# Patient Record
Sex: Female | Born: 1965 | Race: White | Hispanic: No | Marital: Married | Smoking: Never smoker
Health system: Southern US, Community
[De-identification: ages and names within clinical notes are randomized; demographics above are authoritative.]

## PROBLEM LIST (undated history)

## (undated) DIAGNOSIS — I6529 Occlusion and stenosis of unspecified carotid artery: Secondary | ICD-10-CM

## (undated) DIAGNOSIS — G4730 Sleep apnea, unspecified: Secondary | ICD-10-CM

## (undated) DIAGNOSIS — T7840XA Allergy, unspecified, initial encounter: Secondary | ICD-10-CM

## (undated) DIAGNOSIS — M7660 Achilles tendinitis, unspecified leg: Secondary | ICD-10-CM

## (undated) DIAGNOSIS — L509 Urticaria, unspecified: Secondary | ICD-10-CM

## (undated) DIAGNOSIS — E7800 Pure hypercholesterolemia, unspecified: Secondary | ICD-10-CM

## (undated) DIAGNOSIS — S0300XA Dislocation of jaw, unspecified side, initial encounter: Secondary | ICD-10-CM

## (undated) DIAGNOSIS — L309 Dermatitis, unspecified: Secondary | ICD-10-CM

## (undated) HISTORY — PX: WISDOM TOOTH EXTRACTION: SHX21

## (undated) HISTORY — PX: COLONOSCOPY: SHX174

## (undated) HISTORY — DX: Sleep apnea, unspecified: G47.30

## (undated) HISTORY — DX: Dislocation of jaw, unspecified side, initial encounter: S03.00XA

## (undated) HISTORY — DX: Urticaria, unspecified: L50.9

## (undated) HISTORY — DX: Dermatitis, unspecified: L30.9

## (undated) HISTORY — DX: Achilles tendinitis, unspecified leg: M76.60

## (undated) HISTORY — DX: Occlusion and stenosis of unspecified carotid artery: I65.29

## (undated) HISTORY — DX: Allergy, unspecified, initial encounter: T78.40XA

## (undated) HISTORY — PX: RETINAL LASER PROCEDURE: SHX2339

## (undated) HISTORY — DX: Pure hypercholesterolemia, unspecified: E78.00

## (undated) HISTORY — PX: DILATION AND CURETTAGE OF UTERUS: SHX78

---

## 1988-04-08 HISTORY — PX: KNEE ARTHROSCOPY: SUR90

## 2001-05-26 ENCOUNTER — Other Ambulatory Visit: Admission: RE | Admit: 2001-05-26 | Discharge: 2001-05-26 | Payer: Self-pay | Admitting: Obstetrics & Gynecology

## 2001-07-03 ENCOUNTER — Encounter (INDEPENDENT_AMBULATORY_CARE_PROVIDER_SITE_OTHER): Payer: Self-pay

## 2001-07-03 ENCOUNTER — Ambulatory Visit (HOSPITAL_COMMUNITY): Admission: RE | Admit: 2001-07-03 | Discharge: 2001-07-03 | Payer: Self-pay | Admitting: Obstetrics & Gynecology

## 2002-04-06 ENCOUNTER — Other Ambulatory Visit: Admission: RE | Admit: 2002-04-06 | Discharge: 2002-04-06 | Payer: Self-pay | Admitting: Obstetrics & Gynecology

## 2002-06-11 ENCOUNTER — Encounter: Payer: Self-pay | Admitting: Obstetrics and Gynecology

## 2002-06-11 ENCOUNTER — Ambulatory Visit (HOSPITAL_COMMUNITY): Admission: RE | Admit: 2002-06-11 | Discharge: 2002-06-11 | Payer: Self-pay | Admitting: Obstetrics and Gynecology

## 2002-10-14 ENCOUNTER — Inpatient Hospital Stay (HOSPITAL_COMMUNITY): Admission: AD | Admit: 2002-10-14 | Discharge: 2002-10-14 | Payer: Self-pay | Admitting: Obstetrics & Gynecology

## 2002-10-26 ENCOUNTER — Inpatient Hospital Stay (HOSPITAL_COMMUNITY): Admission: AD | Admit: 2002-10-26 | Discharge: 2002-10-28 | Payer: Self-pay | Admitting: Obstetrics and Gynecology

## 2002-12-02 ENCOUNTER — Other Ambulatory Visit: Admission: RE | Admit: 2002-12-02 | Discharge: 2002-12-02 | Payer: Self-pay | Admitting: Obstetrics & Gynecology

## 2003-01-17 ENCOUNTER — Ambulatory Visit (HOSPITAL_COMMUNITY): Admission: RE | Admit: 2003-01-17 | Discharge: 2003-01-17 | Payer: Self-pay | Admitting: Internal Medicine

## 2003-01-17 ENCOUNTER — Encounter: Payer: Self-pay | Admitting: Internal Medicine

## 2003-03-29 ENCOUNTER — Ambulatory Visit (HOSPITAL_COMMUNITY): Admission: RE | Admit: 2003-03-29 | Discharge: 2003-03-29 | Payer: Self-pay | Admitting: Internal Medicine

## 2003-04-15 ENCOUNTER — Emergency Department (HOSPITAL_COMMUNITY): Admission: EM | Admit: 2003-04-15 | Discharge: 2003-04-16 | Payer: Self-pay | Admitting: Emergency Medicine

## 2003-07-25 ENCOUNTER — Ambulatory Visit (HOSPITAL_COMMUNITY): Admission: RE | Admit: 2003-07-25 | Discharge: 2003-07-25 | Payer: Self-pay | Admitting: Endocrinology

## 2003-11-25 ENCOUNTER — Encounter (INDEPENDENT_AMBULATORY_CARE_PROVIDER_SITE_OTHER): Payer: Self-pay | Admitting: *Deleted

## 2003-11-25 ENCOUNTER — Ambulatory Visit (HOSPITAL_COMMUNITY): Admission: RE | Admit: 2003-11-25 | Discharge: 2003-11-25 | Payer: Self-pay | Admitting: Obstetrics & Gynecology

## 2004-03-16 ENCOUNTER — Encounter (INDEPENDENT_AMBULATORY_CARE_PROVIDER_SITE_OTHER): Payer: Self-pay | Admitting: Specialist

## 2004-03-16 ENCOUNTER — Ambulatory Visit (HOSPITAL_COMMUNITY): Admission: RE | Admit: 2004-03-16 | Discharge: 2004-03-16 | Payer: Self-pay | Admitting: Obstetrics & Gynecology

## 2004-09-06 ENCOUNTER — Other Ambulatory Visit: Admission: RE | Admit: 2004-09-06 | Discharge: 2004-09-06 | Payer: Self-pay | Admitting: Obstetrics & Gynecology

## 2005-02-13 ENCOUNTER — Inpatient Hospital Stay (HOSPITAL_COMMUNITY): Admission: AD | Admit: 2005-02-13 | Discharge: 2005-02-13 | Payer: Self-pay | Admitting: Obstetrics and Gynecology

## 2005-03-14 ENCOUNTER — Inpatient Hospital Stay (HOSPITAL_COMMUNITY): Admission: AD | Admit: 2005-03-14 | Discharge: 2005-03-15 | Payer: Self-pay | Admitting: Obstetrics and Gynecology

## 2005-04-30 ENCOUNTER — Other Ambulatory Visit: Admission: RE | Admit: 2005-04-30 | Discharge: 2005-04-30 | Payer: Self-pay | Admitting: Obstetrics & Gynecology

## 2008-04-29 ENCOUNTER — Ambulatory Visit (HOSPITAL_COMMUNITY): Admission: RE | Admit: 2008-04-29 | Discharge: 2008-04-29 | Payer: Self-pay | Admitting: Internal Medicine

## 2008-10-03 ENCOUNTER — Encounter: Payer: Self-pay | Admitting: Maternal & Fetal Medicine

## 2010-04-16 ENCOUNTER — Ambulatory Visit (HOSPITAL_COMMUNITY)
Admission: RE | Admit: 2010-04-16 | Discharge: 2010-04-16 | Payer: Self-pay | Source: Home / Self Care | Attending: Obstetrics and Gynecology | Admitting: Obstetrics and Gynecology

## 2010-04-16 ENCOUNTER — Encounter (INDEPENDENT_AMBULATORY_CARE_PROVIDER_SITE_OTHER): Payer: Self-pay | Admitting: *Deleted

## 2010-04-19 ENCOUNTER — Encounter: Payer: Self-pay | Admitting: Gastroenterology

## 2010-04-19 ENCOUNTER — Encounter (INDEPENDENT_AMBULATORY_CARE_PROVIDER_SITE_OTHER): Payer: Self-pay | Admitting: *Deleted

## 2010-04-29 ENCOUNTER — Encounter: Payer: Self-pay | Admitting: Internal Medicine

## 2010-05-09 ENCOUNTER — Encounter: Payer: Self-pay | Admitting: Obstetrics and Gynecology

## 2010-05-10 NOTE — Letter (Signed)
Summary: New Patient letter  [facility]  [address] [place], [place] [address]   Phone: [phone]  Fax: [phone]       04/19/2010 MRN: [id number]  [facility] [address] [place], [place]  [address]  Dear Katelyn Wagner,  Welcome to the Gastroenterology Division at [facility].    You are scheduled to see Dr.  Christella Hartigan on 05-25-2010 at 3pm on the 3rd floor at [facility], [address]  We ask that you try to arrive at our office 15 minutes prior to your appointment time to allow for check-in.  We would like you to complete the enclosed self-administered evaluation form prior to your visit and bring it with you on the day of your appointment.  We will review it with you.  Also, please bring a complete list of all your medications or, if you prefer, bring the medication bottles and we will list them.  Please bring your insurance card so that we may make a copy of it.  If your insurance requires a referral to see a specialist, please bring your referral form from your primary care physician.  Co-payments are due at the time of your visit and may be paid by cash, check or credit card.     Your office visit will consist of a consult with your physician (includes a physical exam), any laboratory testing he/she may order, scheduling of any necessary diagnostic testing (e.g. x-ray, ultrasound, CT-scan), and scheduling of a procedure (e.g. Endoscopy, Colonoscopy) if required.  Please allow enough time on your schedule to allow for any/all of these possibilities.    If you cannot keep your appointment, please call [phone] to cancel or reschedule prior to your appointment date.  This allows [place] the opportunity to schedule an appointment for another patient in need of care.  If you do not cancel or reschedule by 5 p.m. the business day prior to your appointment date, you will be charged a $50.00 late cancellation/no-show fee.    Thank you for choosing [facility] for your medical needs.  We appreciate the opportunity to care for you.  Please visit [place] at our website  to learn more about our practice.                     Sincerely,                                                             The Gastroenterology Division

## 2010-05-10 NOTE — Letter (Signed)
Summary: Pre Visit Letter Revised  [facility]  [address] [place], [place] [address]   Phone: [phone]  Fax: [phone]        04/19/2010 MRN: [id number] [facility] [address] [place], [place]  [address]             Procedure Date:  06-06-10  Welcome to the Gastroenterology Division at [facility].    You are scheduled to see a nurse for your pre-procedure visit on 05-23-10 at 2:30p.m. on the 3rd floor at [facility], [address].  We ask that you try to arrive at our office 15 minutes prior to your appointment time to allow for check-in.  Please take a minute to review the attached form.  If you answer "Yes" to one or more of the questions on the first page, we ask that you call the person listed at your earliest opportunity.  If you answer "No" to all of the questions, please complete the rest of the form and bring it to your appointment.    Your nurse visit will consist of discussing your medical and surgical history, your immediate family medical history, and your medications.   If you are unable to list all of your medications on the form, please bring the medication bottles to your appointment and we will list them.  We will need to be aware of both prescribed and over the counter drugs.  We will need to know exact dosage information as well.    Please be prepared to read and sign documents such as consent forms, a financial agreement, and acknowledgement forms.  If necessary, and with your consent, a friend or relative is welcome to sit-in on the nurse visit with you.  Please bring your insurance card so that we may make a copy of it.  If your insurance requires a referral to see a specialist, please bring your referral form from your primary care physician.  No co-pay is required for this nurse visit.     If you cannot keep your appointment, please call [phone] to cancel or reschedule prior to your appointment date.  This allows [place] the  opportunity to schedule an appointment for another patient in need of care.    Thank you for choosing [facility] for your medical needs.  We appreciate the opportunity to care for you.  Please visit [place] at our website  to learn more about our practice.  Sincerely, The Gastroenterology Division

## 2010-06-05 ENCOUNTER — Encounter: Payer: Self-pay | Admitting: Gastroenterology

## 2010-06-05 ENCOUNTER — Ambulatory Visit (INDEPENDENT_AMBULATORY_CARE_PROVIDER_SITE_OTHER): Payer: 59 | Admitting: Gastroenterology

## 2010-06-05 ENCOUNTER — Encounter (INDEPENDENT_AMBULATORY_CARE_PROVIDER_SITE_OTHER): Payer: Self-pay | Admitting: *Deleted

## 2010-06-05 DIAGNOSIS — R933 Abnormal findings on diagnostic imaging of other parts of digestive tract: Secondary | ICD-10-CM | POA: Insufficient documentation

## 2010-06-05 DIAGNOSIS — Z800 Family history of malignant neoplasm of digestive organs: Secondary | ICD-10-CM

## 2010-06-14 NOTE — Assessment & Plan Note (Signed)
History of Present Illness Visit Type: Initial Consult Primary GI MD: Rob Bunting MD Primary Provider: Merri Brunette, MD Requesting Provider: Marcelle Overlie, MD Chief Complaint: Consult Colonoscopy, Father Dx w/ Colon cancer in November 2011. History of Present Illness:       very pleasant 45 year old woman  her father has colon cancer, stage 4, at at 81.   She also was having some burning in her lower abd that has since completely resolved.  She had recurrent bacterial vaginosis, flagyl given twice, then cipro.  She has been taking probiotics..   she recently had a CT scan for these low abdominal, pelvic pains. she was found to have diverticulosis and a slightly thickened sigmoid colon. There was no sign of diverticulitis  No contipation, or diarrhea.  No rectal bleeding.  No           Current Medications (verified): 1)  None  Allergies (verified): No Known Drug Allergies  Past History:  Past Medical History: TMJ  Mild Scoliosis Prominent Thyroid   Past Surgical History: Knee Surgery (left) arthroscopy  Family History: Family History of Colon Cancer: Father   Family History of Diabetes:  Grandmother  Social History: married 2 daughters self employed non drinker non smoker  Review of Systems       Pertinent positive and negative review of systems were noted in the above HPI and GI specific review of systems.  All other review of systems was otherwise negative.   Vital Signs:  Patient profile:   45 year old female Height:      69 inches Weight:      160.13 pounds BMI:     23.73 Pulse rate:   64 / minute Pulse rhythm:   regular BP sitting:   108 / 64  (left arm) Cuff size:   regular  Vitals Entered By: Christie Nottingham CMA Duncan Dull) (June 05, 2010 3:45 PM)  Physical Exam  Additional Exam:  Constitutional: generally well appearing Psychiatric: alert and oriented times 3 Eyes: extraocular movements intact Mouth: oropharynx moist, no  lesions Neck: supple, no lymphadenopathy Cardiovascular: heart regular rate and rythm Lungs: CTA bilaterally Abdomen: soft, non-tender, non-distended, no obvious ascites, no peritoneal signs, normal bowel sounds Extremities: no lower extremity edema bilaterally Skin: no lesions on visible extremities    Impression & Recommendations:  Problem # 1:   family histor colon Cancer, thickened sigmoid colon on CT scan recently  her lower abdominal, pelvic discomfort have completely resolved. we should proceed with a full colonoscopy at her soonest convenience given the thickened sigmoid colon. This is probably artifact. She also has family history of colon cancer. I see no reason for any further blood tests or imaging studies.  Patient Instructions: 1)  You will be scheduled to have a colonoscopy. 2)  The medication list was reviewed and reconciled.  All changed / newly prescribed medications were explained.  A complete medication list was provided to the patient / caregiver.  Appended Document: Orders Update/movi    Clinical Lists Changes  Problems: Added new problem of FAMILIY HX MALIG NEOPLASM OTH URINARY ORGANS (ICD-V16.59) Added new problem of NONSPECIFIC ABN FINDING RAD & OTH EXAM GI TRACT (ICD-793.4) Medications: Added new medication of MOVIPREP 100 GM  SOLR (PEG-KCL-NACL-NASULF-NA ASC-C) As per prep instructions. - Signed Rx of MOVIPREP 100 GM  SOLR (PEG-KCL-NACL-NASULF-NA ASC-C) As per prep instructions.;  #1 x 0;  Signed;  Entered by: Chales Abrahams CMA (AAMA);  Authorized by: Rachael Fee MD;  Method used: Electronically  to [facility] [address] [phone]*, [address] [place], [place], [place]  [address], Ph: [phone] or [phone], Fax: [phone] Orders: Added new Test order of Colonoscopy (Colon) - Signed    Prescriptions: MOVIPREP 100 GM  SOLR (PEG-KCL-NACL-NASULF-NA ASC-C) As per prep instructions.  #1 x 0   Entered by:   Chales Abrahams CMA (AAMA)    Authorized by:   Rachael Fee MD   Signed by:   Chales Abrahams CMA (AAMA) on 06/05/2010   Method used:   [facility]        [facility] [address] [phone]* (retail)       [address]       [place], [place]  [address]       Ph: [phone] or [phone]       Fax: [phone]   RxID:   [id number]

## 2010-06-14 NOTE — Letter (Signed)
Summary: [facility] Instructions  [facility]  [address] [place], [place] [address]   Phone: [phone]  Fax: [phone]       Katelyn Wagner    02-May-1965    MRN: [id number]        Procedure Day /Date:06/15/10 FRI     Arrival Time:830 am     Procedure Time:930 am     Location of Procedure:                    X  [facility] (4th Floor)                        PREPARATION FOR COLONOSCOPY WITH MOVIPREP   Starting 5 days prior to your procedure 06/10/10 do not eat nuts, seeds, popcorn, corn, beans, peas,  salads, or any raw vegetables.  Do not take any fiber supplements (e.g. Metamucil, Citrucel, and Benefiber).  THE DAY BEFORE YOUR PROCEDURE         DATE: 06/14/10  DAY: THURS  1.  Drink clear liquids the entire day-NO SOLID FOOD  2.  Do not drink anything colored red or purple.  Avoid juices with pulp.  No orange juice.  3.  Drink at least 64 oz. (8 glasses) of fluid/clear liquids during the day to prevent dehydration and help the prep work efficiently.  CLEAR LIQUIDS INCLUDE: Water Jello Ice Popsicles Tea (sugar ok, no milk/cream) Powdered fruit flavored drinks Coffee (sugar ok, no milk/cream) Gatorade Juice: apple, white grape, white cranberry  Lemonade Clear bullion, consomm, broth Carbonated beverages (any kind) Strained chicken noodle soup Hard Candy                             4.  In the morning, mix first dose of MoviPrep solution:    Empty 1 Pouch A and 1 Pouch B into the disposable container    Add lukewarm drinking water to the top line of the container. Mix to dissolve    Refrigerate (mixed solution should be used within 24 hrs)  5.  Begin drinking the prep at 5:00 p.m. The MoviPrep container is divided by 4 marks.   Every 15 minutes drink the solution down to the next mark (approximately 8 oz) until the full liter is complete.   6.  Follow completed prep with 16 oz of clear liquid of your choice (Nothing red or purple).   Continue to drink clear liquids until bedtime.  7.  Before going to bed, mix second dose of MoviPrep solution:    Empty 1 Pouch A and 1 Pouch B into the disposable container    Add lukewarm drinking water to the top line of the container. Mix to dissolve    Refrigerate  THE DAY OF YOUR PROCEDURE      DATE: 06/15/10 DAY: Caleen [place]  Beginning at 430 a.m. (5 hours before procedure):         1. Every 15 minutes, drink the solution down to the next mark (approx 8 oz) until the full liter is complete.  2. Follow completed prep with 16 oz. of clear liquid of your choice.    3. You may drink clear liquids until 730 am (2 HOURS BEFORE PROCEDURE).   MEDICATION INSTRUCTIONS  Unless otherwise instructed, you should take regular prescription medications with a small sip of water   as early as possible the morning of your procedure.  OTHER INSTRUCTIONS  You will need a responsible adult at least 45 years of age to accompany you and drive you home.   This person must remain in the waiting room during your procedure.  Wear loose fitting clothing that is easily removed.  Leave jewelry and other valuables at home.  However, you may wish to bring a book to read or  an iPod/MP3 player to listen to music as you wait for your procedure to start.  Remove all body piercing jewelry and leave at home.  Total time from sign-in until discharge is approximately 2-3 hours.  You should go home directly after your procedure and rest.  You can resume normal activities the  day after your procedure.  The day of your procedure you should not:   Drive   Make legal decisions   Operate machinery   Drink alcohol   Return to work  You will receive specific instructions about eating, activities and medications before you leave.    The above instructions have been reviewed and explained to me by   _______________________    I fully understand and can verbalize these instructions  _____________________________ Date _________

## 2010-06-15 ENCOUNTER — Other Ambulatory Visit: Payer: 59 | Admitting: Gastroenterology

## 2010-07-24 ENCOUNTER — Other Ambulatory Visit: Payer: 59 | Admitting: Gastroenterology

## 2010-08-17 ENCOUNTER — Other Ambulatory Visit: Payer: 59 | Admitting: Gastroenterology

## 2010-08-24 NOTE — Op Note (Signed)
[facility]  PatientIVONNE, FREEBURG Visit Number: [id number] MRN: [id number]          Service Type: DSU Location: [facility] Attending Physician:  Minette Headland Dictated by:   Freddy Finner, M.D. Proc. 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  The patient has requested voluntary interruption of her pregnancy.  She is admitted at this time for that purpose.  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  The patient has requested voluntary interruption of her pregnancy.  She is admitted at this time for that purpose.  OPERATION:                    Dilatation and evacuation.  SURGEON:                      Freddy Finner, M.D.  ANESTHESIA:                   General.  COMPLICATIONS:                None.  ESTIMATED BLOOD LOSS:         100 cc or less.  INDICATIONS:                  The patient is a 45 year old who had a level 2 scan with Dr. Norina Buzzard at [facility].  The fetus was found to have a marked anomaly with absence of one diaphragm.  The fetal chromosomes were pending, although the fish test at the time of the ultrasound has not shown Downs syndrome.  The patient requested definitive intervention to terminate the pregnancy and did not feel like she could tolerate laboring to do so.  It was felt the safest procedure was to proceed with D&E.  The potential risk of this procedure including the risk of perforation and the actual loss of fertility was discussed with the patient.  DESCRIPTION OF PROCEDURE:     A thick laminaria was placed in the cervix on the day prior to surgery and a gauze pack placed to hold it into  position. She was admitted on the day of surgery.  She was brought to the operating room and placed under adequate anesthesia.  Placed in the dorsal lithotomy position.  Betadine prep of the perineum and vagina was carried out.  The sponge and laminaria were then removed digitally.  Additional Betadine prep was carried out.  Bivalve speculum was introduced.  Anterior cervical lip was grasped with the ring forceps.  The cervix was progressively dilated to 47 with Hegar dilators.  A 14 mm suction cannula was then introduced and aspiration produced obvious parts of conception.  This was continued until it was felt that the majority of tissue was removed.  Large placental forceps were then used to remove remaining segments of cranium.  Repeat vacuum aspiration was carried out.  Gentle sharp curettage and repeat exploration with the placental forceps confirmed complete evacuation of the cavity.  The procedure was terminated.  At  this point, the instruments were removed. Fetal lower extremity was sent for chromosomal analysis.  The patient was given intravenous Pitocin in her IV fluid and 0.25 of IM Methergine.  She was treated perioperatively with IV Cefotan 1 gm and gentamicin.  This was done as prophylaxis for SBE because of mitral valve prolapse.  The patient did tolerate the procedure well.  After recovering, she was discharged home with routine outpatient surgical instructions and for follow up in the office in approximately two weeks. Dictated by:   Freddy Finner, M.D. Attending Physician:  Minette Headland DD:  07/05/01 TD:  07/05/01 Job: [id number]

## 2010-08-24 NOTE — Op Note (Signed)
Katelyn Wagner, Katelyn Wagner                 ACCOUNT NO.:  [id number]   MEDICAL RECORD NO.:  [id number]          PATIENT TYPE:  AMB   LOCATION:  SDC                           FACILITY:  [facility]   PHYSICIAN:  Freddy Finner, M.D.   DATE OF BIRTH:  January 27, 1966   DATE OF PROCEDURE:  03/16/2004  DATE OF DISCHARGE:                                 OPERATIVE REPORT   PREOPERATIVE DIAGNOSIS:  Missed abortion.   POSTOPERATIVE DIAGNOSIS:  Missed abortion.   OPERATIVE PROCEDURE:  D&E.   SURGEON:  Freddy Finner, M.D.   ANESTHESIA:  Managed intravenous sedation with paracervical block.   ESTIMATED INTRAOPERATIVE BLOOD LOSS:  50 mL.   INTRAOPERATIVE COMPLICATIONS:  None.   The patient is a 45 year old who was found to have what appeared to be an  early triplet pregnancy.  Over a period of approximately 3 weeks she had no  growth of the sacs and deterioration of the placenta.  There was no fetal  pole or yolk sac in any of the gestational sacs.  The patient is now  admitted for D&E.   She was admitted on the morning of surgery, brought to the operating room,  placed under adequate intravenous sedation, placed in the dorsal lithotomy  position.  Betadine prep was carried out in the usual fashion.  Sterile  drapes were applied.  The cervix was visualized using a bivalve vaginal  speculum.  The cervix was grasped on the anterior lip with a single tooth  tenaculum.  Paracervical block was placed using 10 mL of 1% plain Xylocaine.  Injections were made at 4 and 8 o'clock in the vaginal fornices.  The cervix  was progressively dilated to 27 with Pratts.  A 9 mm curved suction cannula  was introduced and aspiration produced obvious products of conception.  The  vacuum aspiration was continued until it was felt the cavity was evacuated.  This was confirmed by gentle curettage and exploration with the Randall  stone forceps and repeat vacuum aspiration.  The patient tolerated the  operative procedure well, all  instruments removed.  She was given Toradol 30  mg IV.  She was awakened and taken to the recovery room in good condition.     Hosie Spangle   WRN/MEDQ  D:  03/16/2004  T:  03/16/2004  Job:  [id number]

## 2010-08-24 NOTE — Op Note (Signed)
Katelyn Wagner, Katelyn Wagner                             ACCOUNT NO.:  [id number]   MEDICAL RECORD NO.:  [id number]                   PATIENT TYPE:  AMB   LOCATION:  SDC                                  FACILITY:  [facility]   PHYSICIAN:  Freddy Finner, M.D.                DATE OF BIRTH:  Apr 15, 1965   DATE OF PROCEDURE:  DATE OF DISCHARGE:                                 OPERATIVE REPORT   PREOPERATIVE DIAGNOSIS:  Missed abortion   POSTOPERATIVE DIAGNOSIS:  Missed abortion.   OPERATIVE PROCEDURE:  Dilation evacuation   SURGEON:  Freddy Finner, M.D.   ANESTHESIA:  Intravenous sedation, paracervical block.   ESTIMATED BLOOD LOSS:  Less than or equal to 20 cc   INTRAOPERATIVE COMPLICATIONS:  None.   BLOOD TYPE:  Rh positive   The patient is a 45 year old who presented in early pregnancy and was found  to have a nonviable intrauterine pregnancy measuring less than dates but  showing a gestational sac  and a yolk sac but no fetus.  She is now admitted  for D&E.   She was admitted on the day of surgery, brought to the operating room under  adequate intravenous sedation, placed in the dorsal lithotomy position using  the Stanton stirrups system.  Betadine prep was carried out in the usual  fashion after she received intravenous sedation.  Bivalved speculum was  introduced and cervix visualized.  Pericervical block was placed using 10 cc  of 1% Xylocaine, injections of approximately 5 cc provided each of the 4 and  8 o'clock sites.  Cervix was grasped with the anterior lip with a single  tooth tenaculum.  Cervix was dilated to 23 with Pratt's.  A 7 mm curved  suction cannula was introduced and aspiration produced obvious products of  conception.  This was continued until it was felt the cavity was evacuated.  Gentle sharp curettage and exploration with Randall-Stokes forceps was  carried out, followed by repeat vacuum aspiration.  It was felt that the  procedure was complete and all parts of  conception removed.  The patient was  given 0.2 mg of Methergine IM, she was given 30 mg of Toradol IV for  postoperative analgesia.  She was taken to the recovery room in good  condition and will be discharged in the immediate postoperative period for  follow up in the office in approximately 2 weeks.  She is to avoid vaginal  entry for at least 1 week.  She is to call for fevers, severe pain or heavy  bleeding.                                               Freddy Finner, M.D.    WRN/MEDQ  D:  11/25/2003  T:  11/25/2003  Job:  [id number]

## 2010-09-07 ENCOUNTER — Other Ambulatory Visit: Payer: 59 | Admitting: Gastroenterology

## 2010-11-02 ENCOUNTER — Other Ambulatory Visit: Payer: 59 | Admitting: Gastroenterology

## 2010-12-19 ENCOUNTER — Encounter: Payer: Self-pay | Admitting: Gastroenterology

## 2010-12-19 ENCOUNTER — Ambulatory Visit (AMBULATORY_SURGERY_CENTER): Payer: 59 | Admitting: *Deleted

## 2010-12-19 VITALS — Ht 69.0 in | Wt 168.0 lb

## 2010-12-19 DIAGNOSIS — R933 Abnormal findings on diagnostic imaging of other parts of digestive tract: Secondary | ICD-10-CM

## 2010-12-19 DIAGNOSIS — Z1211 Encounter for screening for malignant neoplasm of colon: Secondary | ICD-10-CM

## 2010-12-19 DIAGNOSIS — Z800 Family history of malignant neoplasm of digestive organs: Secondary | ICD-10-CM

## 2010-12-19 MED ORDER — SUPREP BOWEL PREP KIT 17.5-3.13-1.6 GM/177ML PO SOLN: 1.0000 | ORAL | Status: DC

## 2010-12-26 ENCOUNTER — Ambulatory Visit (AMBULATORY_SURGERY_CENTER): Payer: 59 | Admitting: Gastroenterology

## 2010-12-26 ENCOUNTER — Encounter: Payer: Self-pay | Admitting: Gastroenterology

## 2010-12-26 DIAGNOSIS — Z800 Family history of malignant neoplasm of digestive organs: Secondary | ICD-10-CM

## 2010-12-26 DIAGNOSIS — R933 Abnormal findings on diagnostic imaging of other parts of digestive tract: Secondary | ICD-10-CM

## 2010-12-26 DIAGNOSIS — Z1211 Encounter for screening for malignant neoplasm of colon: Secondary | ICD-10-CM

## 2010-12-26 DIAGNOSIS — D126 Benign neoplasm of colon, unspecified: Secondary | ICD-10-CM

## 2010-12-26 MED ORDER — SODIUM CHLORIDE 0.9 % IV SOLN: 500.0000 mL | INTRAVENOUS | Status: DC

## 2010-12-26 NOTE — Patient Instructions (Signed)
Green and blue discharge instructions reviewed with patient and care partner.  Impressions/recommendations:  Polyp (handout given) Diverticulosis (handout given)  High fiber diet handout given.  Resume medications as you were taking them prior to your procedure.

## 2010-12-27 ENCOUNTER — Telehealth: Payer: Self-pay | Admitting: *Deleted

## 2010-12-27 NOTE — Telephone Encounter (Signed)

## 2012-06-22 ENCOUNTER — Other Ambulatory Visit (HOSPITAL_COMMUNITY): Payer: Self-pay | Admitting: Endocrinology

## 2012-06-22 DIAGNOSIS — E041 Nontoxic single thyroid nodule: Secondary | ICD-10-CM

## 2012-06-25 ENCOUNTER — Ambulatory Visit (HOSPITAL_COMMUNITY)
Admission: RE | Admit: 2012-06-25 | Discharge: 2012-06-25 | Disposition: A | Discharge status: Home / Self Care | Payer: 59 | Source: Ambulatory Visit | Attending: Endocrinology | Admitting: Endocrinology

## 2012-06-25 DIAGNOSIS — E041 Nontoxic single thyroid nodule: Secondary | ICD-10-CM | POA: Insufficient documentation

## 2012-11-02 ENCOUNTER — Other Ambulatory Visit (HOSPITAL_COMMUNITY): Payer: Self-pay | Admitting: Endocrinology

## 2012-11-02 DIAGNOSIS — E041 Nontoxic single thyroid nodule: Secondary | ICD-10-CM

## 2012-12-10 ENCOUNTER — Telehealth (HOSPITAL_COMMUNITY): Payer: Self-pay | Admitting: *Deleted

## 2012-12-15 ENCOUNTER — Telehealth (HOSPITAL_COMMUNITY): Payer: Self-pay | Admitting: Internal Medicine

## 2012-12-22 ENCOUNTER — Telehealth (HOSPITAL_COMMUNITY): Payer: Self-pay | Admitting: *Deleted

## 2013-01-01 ENCOUNTER — Telehealth (HOSPITAL_COMMUNITY): Payer: Self-pay | Admitting: *Deleted

## 2013-01-11 ENCOUNTER — Encounter (HOSPITAL_COMMUNITY): Payer: Self-pay | Admitting: *Deleted

## 2013-01-11 ENCOUNTER — Telehealth (HOSPITAL_COMMUNITY): Payer: Self-pay | Admitting: *Deleted

## 2013-02-22 ENCOUNTER — Emergency Department (HOSPITAL_COMMUNITY)
Admission: EM | Admit: 2013-02-22 | Discharge: 2013-02-22 | Disposition: A | Discharge status: Home / Self Care | Payer: 59 | Attending: Emergency Medicine | Admitting: Emergency Medicine

## 2013-02-22 ENCOUNTER — Encounter (HOSPITAL_COMMUNITY): Payer: Self-pay | Admitting: Emergency Medicine

## 2013-02-22 ENCOUNTER — Emergency Department (HOSPITAL_COMMUNITY): Payer: 59

## 2013-02-22 DIAGNOSIS — S161XXA Strain of muscle, fascia and tendon at neck level, initial encounter: Secondary | ICD-10-CM

## 2013-02-22 DIAGNOSIS — Y92410 Unspecified street and highway as the place of occurrence of the external cause: Secondary | ICD-10-CM | POA: Insufficient documentation

## 2013-02-22 DIAGNOSIS — Y9389 Activity, other specified: Secondary | ICD-10-CM | POA: Insufficient documentation

## 2013-02-22 DIAGNOSIS — S0990XA Unspecified injury of head, initial encounter: Secondary | ICD-10-CM | POA: Insufficient documentation

## 2013-02-22 DIAGNOSIS — S139XXA Sprain of joints and ligaments of unspecified parts of neck, initial encounter: Secondary | ICD-10-CM | POA: Insufficient documentation

## 2013-02-22 DIAGNOSIS — IMO0002 Reserved for concepts with insufficient information to code with codable children: Secondary | ICD-10-CM | POA: Insufficient documentation

## 2013-02-22 MED ORDER — CYCLOBENZAPRINE HCL 10 MG PO TABS: 10.0000 mg | ORAL_TABLET | Freq: Three times a day (TID) | ORAL | Status: DC | PRN
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
## 2013-02-22 NOTE — ED Provider Notes (Signed)
Medical screening examination/treatment/procedure(s) were performed by non-physician practitioner and as supervising physician I was immediately available for consultation/collaboration.  EKG Interpretation   None        Ethelda Chick, MD 02/22/13 2232

## 2013-02-22 NOTE — ED Notes (Signed)
Patient transported to X-ray 

## 2013-02-22 NOTE — ED Notes (Signed)
Patient returned from xray at this time.

## 2013-06-25 ENCOUNTER — Ambulatory Visit (HOSPITAL_COMMUNITY): Payer: 59

## 2014-12-24 ENCOUNTER — Emergency Department (HOSPITAL_COMMUNITY)
Admission: EM | Admit: 2014-12-24 | Discharge: 2014-12-24 | Disposition: A | Discharge status: Home / Self Care | Payer: 59 | Attending: Emergency Medicine | Admitting: Emergency Medicine

## 2014-12-24 ENCOUNTER — Encounter (HOSPITAL_COMMUNITY): Payer: Self-pay | Admitting: Emergency Medicine

## 2014-12-24 ENCOUNTER — Emergency Department (HOSPITAL_COMMUNITY): Payer: 59

## 2014-12-24 DIAGNOSIS — S99912A Unspecified injury of left ankle, initial encounter: Secondary | ICD-10-CM | POA: Diagnosis present

## 2014-12-24 DIAGNOSIS — Y9289 Other specified places as the place of occurrence of the external cause: Secondary | ICD-10-CM | POA: Diagnosis not present

## 2014-12-24 DIAGNOSIS — Y998 Other external cause status: Secondary | ICD-10-CM | POA: Insufficient documentation

## 2014-12-24 DIAGNOSIS — S93402A Sprain of unspecified ligament of left ankle, initial encounter: Secondary | ICD-10-CM | POA: Diagnosis not present

## 2014-12-24 DIAGNOSIS — W1849XA Other slipping, tripping and stumbling without falling, initial encounter: Secondary | ICD-10-CM | POA: Insufficient documentation

## 2014-12-24 DIAGNOSIS — Y9389 Activity, other specified: Secondary | ICD-10-CM | POA: Insufficient documentation

## 2014-12-24 NOTE — ED Notes (Signed)
Applied ankle support to left, demonstrated proper usage of crutches

## 2014-12-24 NOTE — Discharge Instructions (Signed)
1. Medications: alternate ibuprofen and tylenol for pain control, usual home medications 2. Treatment: rest, ice, elevate and use brace, drink plenty of fluids, gentle stretching 3. Follow Up: Please followup with orthopedics as directed or your PCP in 1 week if no improvement for discussion of your diagnoses and further evaluation after today's visit; if you do not have a primary care doctor use the resource guide provided to find one; Please return to the ER for worsening symptoms or other concerns   Ankle Sprain An ankle sprain is an injury to the strong, fibrous tissues (ligaments) that hold the bones of your ankle joint together.  CAUSES An ankle sprain is usually caused by a fall or by twisting your ankle. Ankle sprains most commonly occur when you step on the outer edge of your foot, and your ankle turns inward. People who participate in sports are more prone to these types of injuries.  SYMPTOMS   Pain in your ankle. The pain may be present at rest or only when you are trying to stand or walk.  Swelling.  Bruising. Bruising may develop immediately or within 1 to 2 days after your injury.  Difficulty standing or walking, particularly when turning corners or changing directions. DIAGNOSIS  Your caregiver will ask you details about your injury and perform a physical exam of your ankle to determine if you have an ankle sprain. During the physical exam, your caregiver will press on and apply pressure to specific areas of your foot and ankle. Your caregiver will try to move your ankle in certain ways. An X-ray exam may be done to be sure a bone was not broken or a ligament did not separate from one of the bones in your ankle (avulsion fracture).  TREATMENT  Certain types of braces can help stabilize your ankle. Your caregiver can make a recommendation for this. Your caregiver may recommend the use of medicine for pain. If your sprain is severe, your caregiver may refer you to a surgeon who  helps to restore function to parts of your skeletal system (orthopedist) or a physical therapist. [place] ice to your injury for 1-2 days or as directed by your caregiver. Applying ice helps to reduce inflammation and pain.  Put ice in a plastic bag.  Place a towel between your skin and the bag.  Leave the ice on for 15-20 minutes at a time, every 2 hours while you are awake.  Only take over-the-counter or prescription medicines for pain, discomfort, or fever as directed by your caregiver.  Elevate your injured ankle above the level of your heart as much as possible for 2-3 days.  If your caregiver recommends crutches, use them as instructed. Gradually put weight on the affected ankle. Continue to use crutches or a cane until you can walk without feeling pain in your ankle.  If you have a plaster splint, wear the splint as directed by your caregiver. Do not rest it on anything harder than a pillow for the first 24 hours. Do not put weight on it. Do not get it wet. You may take it off to take a shower or bath.  You may have been given an elastic bandage to wear around your ankle to provide support. If the elastic bandage is too tight (you have numbness or tingling in your foot or your foot becomes cold and blue), adjust the bandage to make it comfortable.  If you have an air splint, you may blow more air  into it or let air out to make it more comfortable. You may take your splint off at night and before taking a shower or bath. Wiggle your toes in the splint several times per day to decrease swelling. SEEK MEDICAL CARE IF:   You have rapidly increasing bruising or swelling.  Your toes feel extremely cold or you lose feeling in your foot.  Your pain is not relieved with medicine. SEEK IMMEDIATE MEDICAL CARE IF:  Your toes are numb or blue.  You have severe pain that is increasing. MAKE SURE YOU:   Understand these instructions.  Will watch your  condition.  Will get help right away if you are not doing well or get worse. Document Released: 03/25/2005 Document Revised: 12/18/2011 Document Reviewed: 04/06/2011 [facility] Patient Information 2015 [place], [place]. This information is not intended to replace advice given to you by your health care provider. Make sure you discuss any questions you have with your health care provider.

## 2014-12-24 NOTE — ED Notes (Signed)
Pt reports she twisted L ankle 1 hour ago. Pms intact.

## 2014-12-24 NOTE — ED Provider Notes (Signed)
CSN: [id number]     Arrival date & time 12/24/14  2029 History  This chart was scribed for non-physician practitioner Abigail Butts, PA-C, working with Noemi Chapel, MD by Ludger Nutting, ED Scribe. This patient was seen in room TR05C/TR05C and the patient's care was started at 10:30 PM.    Chief Complaint  Patient presents with  . Ankle Injury   The history is provided by the patient. No language interpreter was used.     HPI Comments: Katelyn Wagner is a 49 y.o. female who presents to the Emergency Department complaining of a left ankle injury that occurred 3.5 hours ago when she tripped over her dog. She reports sudden onset, constant, unchanged left ankle pain that is worse with bearing weight and walking. She reports associated swelling for which she has applied ice with some relief. She has taken ibuprofen 800 mg with mild relief. She denies head injury or LOC. She denies anti-coagulant use. She denies weakness, numbness.    Past Medical History  Diagnosis Date  . Allergy     sulfer dioxide  . Pain in Achilles tendon     bilateral   Past Surgical History  Procedure Laterality Date  . Knee arthroscopy  1990    left  . Dilation and curettage of uterus  2010 &2005   Family History  Problem Relation Age of Onset  . Colon cancer Father    Social History  Substance Use Topics  . Smoking status: Never Smoker   . Smokeless tobacco: Never Used  . Alcohol Use: No   OB History    No data available     Review of Systems  Constitutional: Negative for fever and chills.  Gastrointestinal: Negative for nausea and vomiting.  Musculoskeletal: Positive for myalgias, joint swelling and arthralgias. Negative for back pain, neck pain and neck stiffness.  Skin: Negative for wound.  Neurological: Negative for syncope, weakness and numbness.  Hematological: Does not bruise/bleed easily.  All other systems reviewed and are negative.     Allergies  Review of patient's  allergies indicates no known allergies.  Home Medications   Prior to Admission medications   Medication Sig Start Date End Date Taking? Authorizing Provider  cyclobenzaprine (FLEXERIL) 10 MG tablet Take 1 tablet (10 mg total) by mouth 3 (three) times daily as needed for muscle spasms. 02/22/13   Nicole Pisciotta, PA-C  diphenhydrAMINE (BENADRYL) 25 mg capsule Take 25-50 mg by mouth every 6 (six) hours as needed.    Historical Provider, MD  ibuprofen (ADVIL,MOTRIN) 200 MG tablet Take 400 mg by mouth every 6 (six) hours as needed. Headaches and tendonopathy     Historical Provider, MD  SUPREP BOWEL PREP SOLN Take 1 kit by mouth as directed. 12/19/10   Milus Banister, MD   BP 128/80 mmHg  Pulse 68  Temp(Src) 98.2 F (36.8 C) (Oral)  Resp 16  SpO2 100%  LMP 12/09/2014 Physical Exam  Constitutional: She appears well-developed and well-nourished. No distress.  HENT:  Head: Normocephalic and atraumatic.  Eyes: Conjunctivae are normal.  Neck: Normal range of motion.  Cardiovascular: Normal rate, regular rhythm and intact distal pulses.   Capillary refill < 3 sec  Pulmonary/Chest: Effort normal and breath sounds normal.  Musculoskeletal: She exhibits tenderness. She exhibits no edema.  ROM: full ROM of the left great toe, left ankle, left knee, and left hip. Swelling and ecchymosis to the lateral portion of left ankle and proximal forefoot with tenderness to palpation but no palpable  deformity.   Neurological: She is alert. Coordination normal.  Sensation intact to dull and sharp in the LLE Strength 5/5 with dorsiflexion and plantar flexion of the left ankle.   Skin: Skin is warm and dry. She is not diaphoretic.  No tenting of the skin  Psychiatric: She has a normal mood and affect.  Nursing note and vitals reviewed.   ED Course  Procedures (including critical care time)  DIAGNOSTIC STUDIES: Oxygen Saturation is 100% on RA, normal by my interpretation.    COORDINATION OF  CARE: 10:33 PM Discussed treatment plan with pt at bedside and pt agreed to plan.   Labs Review Labs Reviewed - No data to display  Imaging Review Dg Ankle Complete Left  12/24/2014   CLINICAL DATA:  49 year old female with trauma and left ankle pain.  EXAM: LEFT ANKLE COMPLETE - 3+ VIEW  COMPARISON:  None.  FINDINGS: There is no evidence of fracture, dislocation, or joint effusion. There is no evidence of arthropathy or other focal bone abnormality. Soft tissues are unremarkable.  IMPRESSION: Negative.   Electronically Signed   By: Anner Crete M.D.   On: 12/24/2014 22:28   I have personally reviewed and evaluated these images as part of my medical decision-making.   EKG Interpretation None      MDM   Final diagnoses:  Ankle sprain, left, initial encounter    Katelyn Wagner presents with ankle pain.  Patient X-Ray negative for obvious fracture or dislocation. Pain managed in ED. Pt advised to follow up with orthopedics if symptoms persist for possibility of missed fracture diagnosis. Patient given brace while in ED, conservative therapy recommended and discussed. Patient will be dc home & is agreeable with above plan.   I personally performed the services described in this documentation, which was scribed in my presence. The recorded information has been reviewed and is accurate.   Jarrett Soho Muthersbaugh, PA-C 12/24/14 2345  Noemi Chapel, MD 12/25/14 [phone]

## 2015-02-06 ENCOUNTER — Emergency Department (HOSPITAL_COMMUNITY): Admission: EM | Admit: 2015-02-06 | Discharge: 2015-02-06 | Discharge status: Absent without leave | Payer: Self-pay

## 2015-10-30 ENCOUNTER — Encounter: Payer: Self-pay | Admitting: Gastroenterology

## 2015-11-06 ENCOUNTER — Encounter: Payer: Self-pay | Admitting: Gastroenterology

## 2015-12-25 ENCOUNTER — Telehealth: Payer: Self-pay | Admitting: *Deleted

## 2015-12-25 ENCOUNTER — Ambulatory Visit (AMBULATORY_SURGERY_CENTER): Payer: Self-pay | Admitting: *Deleted

## 2015-12-25 VITALS — Ht 68.5 in | Wt 164.0 lb

## 2015-12-25 DIAGNOSIS — Z800 Family history of malignant neoplasm of digestive organs: Secondary | ICD-10-CM

## 2015-12-25 MED ORDER — NA SULFATE-K SULFATE-MG SULF 17.5-3.13-1.6 GM/177ML PO SOLN: ORAL | 0 refills | Status: DC

## 2015-12-25 NOTE — Telephone Encounter (Signed)
Patient came in for pre-visit, recall colon is on 01/02/16. She states been having "very mild localized sharpe pain in mid. Chest that last 1-2 minutes and may occur again that day and may happen again in the next few days from that but then goes away and may not have this chest pain for 2-3 months". Pt does not what causes this and denies heartburn. She states this has been happening for about 15 years, and she did tell her PCP and she had EKG done about 5 years ago and that was "normal". I did see in EPIC that Dr.Pharr her PCP order a stress test back in 2014 and she states she never had that done. Patient seems concerns and said she is going to call her PCP to see if she should have the stress test now before colonoscopy. Is she okay to have colonoscopy at this time or wait until stress test complete? Please advise. Thanks, Robbin PV

## 2015-12-25 NOTE — Progress Notes (Signed)
Patient denies any allergies to eggs or soy. Patient denies any problems with anesthesia/sedation. Patient denies any oxygen use at home and does not take any diet/weight loss medications. Sent phone note to Dr.Jacobs.

## 2015-12-25 NOTE — Telephone Encounter (Signed)
It does not sound like anything serious but please cancel the upcoming colonoscopy since she is going to be discussing the chest pains again with her primary care physician and I assume he will reorder the stress test. After the cardiac testing is done I would like to hear back from her about rescheduling the colonoscopy.

## 2015-12-25 NOTE — Telephone Encounter (Signed)
Spoke with patient. Gave her Dr.Jacobs' recommendations. She is aware her colonoscopy is cancelled at this time and SHE needs to call us back once stress is completed and with results to make colon appointment then.

## 2015-12-25 NOTE — Telephone Encounter (Signed)
Called patient, no answer. Left message for her to call me back at my direct office number.

## 2016-01-02 ENCOUNTER — Encounter: Payer: 59 | Admitting: Gastroenterology

## 2016-02-27 ENCOUNTER — Telehealth: Payer: Self-pay

## 2016-02-27 NOTE — Telephone Encounter (Signed)
Katelyn Wagner called and states he was reviewing charts for 03/04/16.  The pt was cancelled because she needed cardiology clearance.  I called the pt and she states she was cleared by Dr Wynonia Lawman and will have the records faxed to our office today or at the latest tomorrow.  I spoke back with Katelyn Wagner and notified him and he will agree to the procedure as long as the records are reviewed and ok'd by Dr Ardis Hughs. I will make sure they get on his desk for review.  Dr Ardis Hughs I am off tomorrow but have left messages for everyone to be on the look out for the records.

## 2016-02-27 NOTE — Telephone Encounter (Signed)
Dr Ardis Hughs the records from cardiology are on your desk

## 2016-02-27 NOTE — Telephone Encounter (Signed)
Patient states that she will fax records over this afternoon and is requesting a callback when we receive the records. best # [phone].

## 2016-02-28 NOTE — Telephone Encounter (Signed)
Spoke with pt and she is aware.

## 2016-02-28 NOTE — Telephone Encounter (Signed)
Office note from her cardiologist Dr. Wynonia Lawman dated 01/03/2016   She underwent a stress test with EKG. Dr. Wynonia Lawman wrote that the test was clinically and EKG negative for ischemia. His recommendation and he states that "her chest pain is atypical for cardiac cause and that there was no evidence of myocardial ischemia.  She is okay to go ahead with her colonoscopy next week.

## 2016-03-04 ENCOUNTER — Ambulatory Visit (AMBULATORY_SURGERY_CENTER): Payer: 59 | Admitting: Gastroenterology

## 2016-03-04 ENCOUNTER — Encounter: Payer: Self-pay | Admitting: Gastroenterology

## 2016-03-04 VITALS — BP 112/68 | HR 58 | Temp 97.8°F | Resp 12 | Ht 68.5 in | Wt 164.0 lb

## 2016-03-04 DIAGNOSIS — K5730 Diverticulosis of large intestine without perforation or abscess without bleeding: Secondary | ICD-10-CM

## 2016-03-04 DIAGNOSIS — Z1212 Encounter for screening for malignant neoplasm of rectum: Secondary | ICD-10-CM | POA: Diagnosis not present

## 2016-03-04 DIAGNOSIS — Z800 Family history of malignant neoplasm of digestive organs: Secondary | ICD-10-CM

## 2016-03-04 DIAGNOSIS — Z1211 Encounter for screening for malignant neoplasm of colon: Secondary | ICD-10-CM

## 2016-03-04 HISTORY — PX: COLONOSCOPY: SHX174

## 2016-03-04 MED ORDER — SODIUM CHLORIDE 0.9 % IV SOLN: 500.0000 mL | INTRAVENOUS | Status: DC

## 2016-03-04 NOTE — Patient Instructions (Signed)
YOU HAD AN ENDOSCOPIC PROCEDURE TODAY AT [facility]:   Refer to the procedure report that was given to you for any specific questions about what was found during the examination.  If the procedure report does not answer your questions, please call your gastroenterologist to clarify.  If you requested that your care partner not be given the details of your procedure findings, then the procedure report has been included in a sealed envelope for you to review at your convenience later.  YOU SHOULD EXPECT: Some feelings of bloating in the abdomen. Passage of more gas than usual.  Walking can help get rid of the air that was put into your GI tract during the procedure and reduce the bloating. If you had a lower endoscopy (such as a colonoscopy or flexible sigmoidoscopy) you may notice spotting of blood in your stool or on the toilet paper. If you underwent a bowel prep for your procedure, you may not have a normal bowel movement for a few days.  Please Note:  You might notice some irritation and congestion in your nose or some drainage.  This is from the oxygen used during your procedure.  There is no need for concern and it should clear up in a day or so.  SYMPTOMS TO REPORT IMMEDIATELY:   Following lower endoscopy (colonoscopy or flexible sigmoidoscopy):  Excessive amounts of blood in the stool  Significant tenderness or worsening of abdominal pains  Swelling of the abdomen that is new, acute  Fever of 100F or higher  For urgent or emergent issues, a gastroenterologist can be reached at any hour by calling [phone].  DIET:  We do recommend a small meal at first, but then you may proceed to your regular diet.  Drink plenty of fluids but you should avoid alcoholic beverages for 24 hours.  ACTIVITY:  You should plan to take it easy for the rest of today and you should NOT DRIVE or use heavy machinery until tomorrow (because of the sedation medicines used during the test).     FOLLOW UP: Our staff will call the number listed on your records the next business day following your procedure to check on you and address any questions or concerns that you may have regarding the information given to you following your procedure. If we do not reach you, we will leave a message.  However, if you are feeling well and you are not experiencing any problems, there is no need to return our call.  We will assume that you have returned to your regular daily activities without incident.  SIGNATURES/CONFIDENTIALITY: You and/or your care partner have signed paperwork which will be entered into your electronic medical record.  These signatures attest to the fact that that the information above on your After Visit Summary has been reviewed and is understood.  Full responsibility of the confidentiality of this discharge information lies with you and/or your care-partner.  Please read over handout about diverticulosis and high fiber diets  Continue your normal medications  Next colonoscopy- 10 years

## 2016-03-04 NOTE — Progress Notes (Signed)
Report given to PACU RN, vss 

## 2016-03-04 NOTE — Op Note (Signed)
[facility] Patient Name: Katelyn Wagner Procedure Date: 03/04/2016 10:44 AM MRN: [id number] Endoscopist: Milus Banister , MD Age: 50 Referring MD:  Date of Birth: [birth year] Gender: Female Account #: [id number] Procedure:                Colonoscopy Indications:              Screening patient at increased risk: Family history                            of 1st-degree relative with colorectal cancer at                            age 73 years (or older) (father diagnosed around                            age 15) Medicines:                Monitored Anesthesia Care Procedure:                Pre-Anesthesia Assessment:                           - Prior to the procedure, a History and Physical                            was performed, and patient medications and                            allergies were reviewed. The patient's tolerance of                            previous anesthesia was also reviewed. The risks                            and benefits of the procedure and the sedation                            options and risks were discussed with the patient.                            All questions were answered, and informed consent                            was obtained. Prior Anticoagulants: The patient has                            taken no previous anticoagulant or antiplatelet                            agents. ASA Grade Assessment: II - A patient with                            mild systemic disease. After reviewing the risks  and benefits, the patient was deemed in                            satisfactory condition to undergo the procedure.                           After obtaining informed consent, the colonoscope                            was passed under direct vision. Throughout the                            procedure, the patient's blood pressure, pulse, and                            oxygen saturations were monitored continuously. The                             Model CF-HQ190L [id number]) scope was introduced                            through the anus and advanced to the the cecum,                            identified by appendiceal orifice and ileocecal                            valve. The colonoscopy was performed without                            difficulty. The patient tolerated the procedure                            well. The quality of the bowel preparation was                            excellent. The ileocecal valve, appendiceal                            orifice, and rectum were photographed. Scope In: 10:50:11 AM Scope Out: 11:00:18 AM Scope Withdrawal Time: 0 hours 7 minutes 18 seconds  Total Procedure Duration: 0 hours 10 minutes 7 seconds  Findings:                 Multiple small and large-mouthed diverticula were                            found in the entire colon.                           The exam was otherwise without abnormality on                            direct and retroflexion views. Complications:  No immediate complications. Estimated Blood Loss:     Estimated blood loss: none. Impression:               - Diverticulosis in the entire examined colon.                           - The examination was otherwise normal on direct                            and retroflexion views.                           - No polyps or cancers Recommendation:           - Patient has a contact number available for                            emergencies. The signs and symptoms of potential                            delayed complications were discussed with the                            patient. Return to normal activities tomorrow.                            Written discharge instructions were provided to the                            patient.                           - Resume previous diet.                           - Continue present medications.                           - Repeat colonoscopy  in 10 years for screening.                            There is no need for colon cancer screening by any                            method (including stool testing) prior to then. Milus Banister, MD 03/04/2016 11:04:09 AM This report has been signed electronically.

## 2016-03-05 ENCOUNTER — Telehealth: Payer: Self-pay

## 2016-03-05 NOTE — Telephone Encounter (Signed)
  Follow up Call-  Call back number 03/04/2016  Post procedure Call Back phone  # [phone]  Permission to leave phone message Yes  Some recent data might be hidden     Patient questions:  Do you have a fever, pain , or abdominal swelling? No. Pain Score  0 *  Have you tolerated food without any problems? Yes.    Have you been able to return to your normal activities? Yes.    Do you have any questions about your discharge instructions: Diet   No. Medications  No. Follow up visit  No.  Do you have questions or concerns about your Care? No.  Actions: * If pain score is 4 or above: No action needed, pain <4.

## 2016-05-15 DIAGNOSIS — J351 Hypertrophy of tonsils: Secondary | ICD-10-CM | POA: Diagnosis not present

## 2016-10-25 DIAGNOSIS — J019 Acute sinusitis, unspecified: Secondary | ICD-10-CM | POA: Diagnosis not present

## 2016-10-25 DIAGNOSIS — R05 Cough: Secondary | ICD-10-CM | POA: Diagnosis not present

## 2016-11-07 DIAGNOSIS — Z01419 Encounter for gynecological examination (general) (routine) without abnormal findings: Secondary | ICD-10-CM | POA: Diagnosis not present

## 2016-11-26 DIAGNOSIS — D1800 Hemangioma unspecified site: Secondary | ICD-10-CM | POA: Diagnosis not present

## 2016-11-26 DIAGNOSIS — L739 Follicular disorder, unspecified: Secondary | ICD-10-CM | POA: Diagnosis not present

## 2016-11-26 DIAGNOSIS — L821 Other seborrheic keratosis: Secondary | ICD-10-CM | POA: Diagnosis not present

## 2016-11-26 DIAGNOSIS — L820 Inflamed seborrheic keratosis: Secondary | ICD-10-CM | POA: Diagnosis not present

## 2016-12-24 DIAGNOSIS — L814 Other melanin hyperpigmentation: Secondary | ICD-10-CM | POA: Diagnosis not present

## 2016-12-24 DIAGNOSIS — L570 Actinic keratosis: Secondary | ICD-10-CM | POA: Diagnosis not present

## 2016-12-24 DIAGNOSIS — L718 Other rosacea: Secondary | ICD-10-CM | POA: Diagnosis not present

## 2016-12-24 DIAGNOSIS — D225 Melanocytic nevi of trunk: Secondary | ICD-10-CM | POA: Diagnosis not present

## 2017-01-07 ENCOUNTER — Other Ambulatory Visit: Payer: Self-pay | Admitting: Internal Medicine

## 2017-01-07 DIAGNOSIS — R300 Dysuria: Secondary | ICD-10-CM | POA: Diagnosis not present

## 2017-01-07 DIAGNOSIS — R109 Unspecified abdominal pain: Secondary | ICD-10-CM

## 2017-01-07 DIAGNOSIS — N390 Urinary tract infection, site not specified: Secondary | ICD-10-CM | POA: Diagnosis not present

## 2017-01-09 ENCOUNTER — Ambulatory Visit
Admission: RE | Admit: 2017-01-09 | Discharge: 2017-01-09 | Disposition: A | Discharge status: Home / Self Care | Payer: 59 | Source: Ambulatory Visit | Attending: Internal Medicine | Admitting: Internal Medicine

## 2017-01-09 DIAGNOSIS — R109 Unspecified abdominal pain: Secondary | ICD-10-CM | POA: Diagnosis not present

## 2017-01-09 DIAGNOSIS — R300 Dysuria: Secondary | ICD-10-CM | POA: Diagnosis not present

## 2017-01-09 DIAGNOSIS — Z23 Encounter for immunization: Secondary | ICD-10-CM | POA: Diagnosis not present

## 2017-01-14 DIAGNOSIS — Z1389 Encounter for screening for other disorder: Secondary | ICD-10-CM | POA: Diagnosis not present

## 2017-01-14 DIAGNOSIS — R300 Dysuria: Secondary | ICD-10-CM | POA: Diagnosis not present

## 2017-01-14 DIAGNOSIS — R1084 Generalized abdominal pain: Secondary | ICD-10-CM | POA: Diagnosis not present

## 2017-01-14 DIAGNOSIS — M25562 Pain in left knee: Secondary | ICD-10-CM | POA: Diagnosis not present

## 2017-01-22 DIAGNOSIS — M25562 Pain in left knee: Secondary | ICD-10-CM | POA: Diagnosis not present

## 2017-05-20 DIAGNOSIS — R82998 Other abnormal findings in urine: Secondary | ICD-10-CM | POA: Diagnosis not present

## 2017-05-20 DIAGNOSIS — Z0000 Encounter for general adult medical examination without abnormal findings: Secondary | ICD-10-CM | POA: Diagnosis not present

## 2017-05-26 DIAGNOSIS — Z0000 Encounter for general adult medical examination without abnormal findings: Secondary | ICD-10-CM | POA: Diagnosis not present

## 2017-05-26 DIAGNOSIS — M25562 Pain in left knee: Secondary | ICD-10-CM | POA: Diagnosis not present

## 2017-05-26 DIAGNOSIS — R1084 Generalized abdominal pain: Secondary | ICD-10-CM | POA: Diagnosis not present

## 2017-05-26 DIAGNOSIS — Z1389 Encounter for screening for other disorder: Secondary | ICD-10-CM | POA: Diagnosis not present

## 2017-05-26 DIAGNOSIS — Z23 Encounter for immunization: Secondary | ICD-10-CM | POA: Diagnosis not present

## 2017-05-26 DIAGNOSIS — R300 Dysuria: Secondary | ICD-10-CM | POA: Diagnosis not present

## 2017-08-13 DIAGNOSIS — M25512 Pain in left shoulder: Secondary | ICD-10-CM | POA: Diagnosis not present

## 2017-08-13 DIAGNOSIS — M25511 Pain in right shoulder: Secondary | ICD-10-CM | POA: Diagnosis not present

## 2017-12-18 DIAGNOSIS — Z01419 Encounter for gynecological examination (general) (routine) without abnormal findings: Secondary | ICD-10-CM | POA: Diagnosis not present

## 2017-12-18 DIAGNOSIS — Z6826 Body mass index (BMI) 26.0-26.9, adult: Secondary | ICD-10-CM | POA: Diagnosis not present

## 2018-01-16 DIAGNOSIS — Z23 Encounter for immunization: Secondary | ICD-10-CM | POA: Diagnosis not present

## 2018-03-16 ENCOUNTER — Ambulatory Visit (INDEPENDENT_AMBULATORY_CARE_PROVIDER_SITE_OTHER): Payer: Self-pay | Admitting: Orthopaedic Surgery

## 2018-03-18 ENCOUNTER — Ambulatory Visit (INDEPENDENT_AMBULATORY_CARE_PROVIDER_SITE_OTHER): Payer: Self-pay | Admitting: Orthopaedic Surgery

## 2018-03-23 ENCOUNTER — Ambulatory Visit (INDEPENDENT_AMBULATORY_CARE_PROVIDER_SITE_OTHER): Payer: Self-pay | Admitting: Orthopaedic Surgery

## 2018-03-24 ENCOUNTER — Ambulatory Visit (INDEPENDENT_AMBULATORY_CARE_PROVIDER_SITE_OTHER): Payer: Self-pay | Admitting: Orthopaedic Surgery

## 2018-03-25 ENCOUNTER — Ambulatory Visit (INDEPENDENT_AMBULATORY_CARE_PROVIDER_SITE_OTHER): Payer: Self-pay | Admitting: Orthopaedic Surgery

## 2018-04-16 DIAGNOSIS — B070 Plantar wart: Secondary | ICD-10-CM | POA: Diagnosis not present

## 2018-04-16 DIAGNOSIS — L853 Xerosis cutis: Secondary | ICD-10-CM | POA: Diagnosis not present

## 2018-04-16 DIAGNOSIS — B078 Other viral warts: Secondary | ICD-10-CM | POA: Diagnosis not present

## 2018-04-16 DIAGNOSIS — D2262 Melanocytic nevi of left upper limb, including shoulder: Secondary | ICD-10-CM | POA: Diagnosis not present

## 2018-04-16 DIAGNOSIS — D2261 Melanocytic nevi of right upper limb, including shoulder: Secondary | ICD-10-CM | POA: Diagnosis not present

## 2018-06-22 DIAGNOSIS — Z0000 Encounter for general adult medical examination without abnormal findings: Secondary | ICD-10-CM | POA: Diagnosis not present

## 2018-06-22 DIAGNOSIS — R82998 Other abnormal findings in urine: Secondary | ICD-10-CM | POA: Diagnosis not present

## 2018-06-24 DIAGNOSIS — R05 Cough: Secondary | ICD-10-CM | POA: Diagnosis not present

## 2018-06-24 DIAGNOSIS — J309 Allergic rhinitis, unspecified: Secondary | ICD-10-CM | POA: Diagnosis not present

## 2018-09-03 ENCOUNTER — Other Ambulatory Visit: Payer: Self-pay | Admitting: Internal Medicine

## 2018-09-03 DIAGNOSIS — M545 Low back pain, unspecified: Secondary | ICD-10-CM

## 2018-09-03 DIAGNOSIS — M5136 Other intervertebral disc degeneration, lumbar region: Secondary | ICD-10-CM

## 2018-09-05 ENCOUNTER — Other Ambulatory Visit: Payer: Self-pay

## 2018-09-05 ENCOUNTER — Ambulatory Visit
Admission: RE | Admit: 2018-09-05 | Discharge: 2018-09-05 | Disposition: A | Discharge status: Home / Self Care | Payer: 59 | Source: Ambulatory Visit | Attending: Internal Medicine | Admitting: Internal Medicine

## 2018-09-05 DIAGNOSIS — M545 Low back pain, unspecified: Secondary | ICD-10-CM

## 2018-09-05 DIAGNOSIS — M5136 Other intervertebral disc degeneration, lumbar region: Secondary | ICD-10-CM

## 2018-10-05 ENCOUNTER — Ambulatory Visit (INDEPENDENT_AMBULATORY_CARE_PROVIDER_SITE_OTHER): Payer: 59 | Admitting: Podiatry

## 2018-10-05 ENCOUNTER — Ambulatory Visit: Payer: 59 | Admitting: Orthotics

## 2018-10-05 ENCOUNTER — Other Ambulatory Visit: Payer: Self-pay

## 2018-10-05 DIAGNOSIS — M7662 Achilles tendinitis, left leg: Secondary | ICD-10-CM | POA: Diagnosis not present

## 2018-10-05 DIAGNOSIS — M7661 Achilles tendinitis, right leg: Secondary | ICD-10-CM | POA: Diagnosis not present

## 2018-10-05 DIAGNOSIS — M6788 Other specified disorders of synovium and tendon, other site: Secondary | ICD-10-CM

## 2018-10-05 DIAGNOSIS — M722 Plantar fascial fibromatosis: Secondary | ICD-10-CM | POA: Diagnosis not present

## 2018-10-05 MED ORDER — DICLOFENAC SODIUM 1 % TD GEL: 2.0000 g | Freq: Four times a day (QID) | TRANSDERMAL | 2 refills | Status: DC

## 2018-10-05 NOTE — Progress Notes (Signed)
Subjective:   Patient ID: Katelyn Wagner, female   DOB: 53 y.o.   MRN: [id number]   HPI 53 year old female presents the office today initially with her daughter however when she was here she was also interested in orthotics and is happy to see her.  She states that she is been having some plantar fasciitis and Achilles tendon issues.  Also she has been suffering with a pinched nerve in her back and she thinks that she has been walking differently and she was to try orthotics to help see if this will help her walk better.  She denies any recent injury or trauma to her lower extremities she denies any swelling.  She gets some occasional numbness which she is relating coming from her pinched nerve in her back.  That has been ongoing about 6 weeks.  She has seen her primary care physician for the.   Review of Systems  All other systems reviewed and are negative.  Past Medical History:  Diagnosis Date  . Allergy    sulfer dioxide  . Pain in Achilles tendon    bilateral  . TMJ (dislocation of temporomandibular joint)     Past Surgical History:  Procedure Laterality Date  . COLONOSCOPY    . DILATION AND CURETTAGE OF UTERUS  2010 &2005  . KNEE ARTHROSCOPY  1990   left     Current Outpatient Medications:  .  diclofenac sodium (VOLTAREN) 1 % GEL, Apply 2 g topically 4 (four) times daily. Rub into affected area of foot 2 to 4 times daily, Disp: 100 g, Rfl: 2 .  FLUoxetine (PROZAC) 20 MG capsule, TK 1 C PO QD, Disp: , Rfl:  .  Multiple Vitamin (MULTIVITAMIN WITH MINERALS) TABS tablet, Take 1 tablet by mouth daily., Disp: , Rfl:  .  Na Sulfate-K Sulfate-Mg Sulf 17.5-3.13-1.6 GM/180ML SOLN, Suprep (no substitutions)-TAKE AS DIRECTED., Disp: 354 mL, Rfl: 0  Current Facility-Administered Medications:  .  0.9 %  sodium chloride infusion, 500 mL, Intravenous, Continuous, Milus Banister, MD  Allergies  Allergen Reactions  . Sulfur Dioxide Other (See Comments)    "throat tighten"          Objective:  Physical Exam  General: AAO x3, NAD  Dermatological: Skin is warm, dry and supple bilateral. Nails x 10 are well manicured; remaining integument appears unremarkable at this time. There are no open sores, no preulcerative lesions, no rash or signs of infection present.  Vascular: Dorsalis Pedis artery and Posterior Tibial artery pedal pulses are 2/4 bilateral with immedate capillary fill time. There is no pain with calf compression, swelling, warmth, erythema.   Neruologic: Grossly intact via light touch bilateral.    Musculoskeletal: Normal to high arch upon weightbearing.  Subjectively she is doing tenderness on the mid substance the Achilles tendon unable to elicit any tenderness today.  Thompson test is negative.  No pain on the course or insertion of the plantar fascia.  No pain with lateral compression of calcaneus.  No area tenderness.  Muscular strength 5/5 in all groups tested bilateral.  Gait: Unassisted, Nonantalgic.       Assessment:   Bilateral Achilles tendinitis, plantar fasciitis     Plan:  -Treatment options discussed including all alternatives, risks, and complications -Etiology of symptoms were discussed -Discussed traction, icing daily.  Prescribed Voltaren gel.  Also she was to proceed with orthotics today.  She was measured today by Katelyn Wagner.   *X-ray if symptoms continue  RTC in 3 weeks to PUO  or sooner if needed.  Trula Slade DPM

## 2018-10-05 NOTE — Patient Instructions (Signed)

## 2018-10-05 NOTE — Progress Notes (Signed)
Patient came into today for casting bilateral f/o to address achilles tendonitis..  Patient reports history of foot pain involving rear foot.s.  Goal is to provide longitudinal arch support and correct any RF instability due to heel eversion/inversion.  Ultimate goal is to relieve tension at pf insertion calcaneal tuberosity.  Plan on semi-rigid device addressing heel stability and relieving PF tension.

## 2018-10-27 ENCOUNTER — Ambulatory Visit: Payer: 59 | Admitting: Orthotics

## 2018-10-27 ENCOUNTER — Other Ambulatory Visit: Payer: Self-pay

## 2018-10-27 DIAGNOSIS — M7661 Achilles tendinitis, right leg: Secondary | ICD-10-CM

## 2018-10-27 DIAGNOSIS — M6788 Other specified disorders of synovium and tendon, other site: Secondary | ICD-10-CM

## 2018-10-27 NOTE — Progress Notes (Signed)
Patient came in today to pick up custom made foot orthotics.  The goals were accomplished and the patient reported no dissatisfaction with said orthotics.  Patient was advised of breakin period and how to report any issues. 

## 2019-06-10 ENCOUNTER — Ambulatory Visit: Payer: 59 | Attending: Internal Medicine

## 2019-06-10 DIAGNOSIS — Z23 Encounter for immunization: Secondary | ICD-10-CM

## 2019-06-10 NOTE — Progress Notes (Signed)
   Covid-19 Vaccination Clinic  Name:  Katelyn Wagner    MRN: [id number] DOB: 03/18/1966  06/10/2019  [name] was observed post Covid-19 immunization for 15 minutes without incident. She was provided with Vaccine Information Sheet and instruction to access the V-Safe system.   [name] was instructed to call 911 with any severe reactions post vaccine: Marland Kitchen Difficulty breathing  . Swelling of face and throat  . A fast heartbeat  . A bad rash all over body  . Dizziness and weakness   Immunizations Administered    Name Date Dose VIS Date Route   Pfizer COVID-19 Vaccine 06/10/2019  8:59 AM 0.3 mL 03/19/2019 Intramuscular   Manufacturer: [place]   Lot: [id number]   [place]: [id number]

## 2019-07-03 ENCOUNTER — Ambulatory Visit: Payer: 59

## 2019-07-03 ENCOUNTER — Ambulatory Visit: Payer: 59 | Attending: Internal Medicine

## 2019-07-03 DIAGNOSIS — Z23 Encounter for immunization: Secondary | ICD-10-CM

## 2019-07-03 NOTE — Progress Notes (Signed)
   Covid-19 Vaccination Clinic  Name:  Katelyn Wagner    MRN: [id number] DOB: 1965/12/14  07/03/2019  Katelyn Wagner was observed post Covid-19 immunization for 15 minutes without incident. She was provided with Vaccine Information Sheet and instruction to access the V-Safe system.   Katelyn Wagner was instructed to call 911 with any severe reactions post vaccine: Marland Kitchen Difficulty breathing  . Swelling of face and throat  . A fast heartbeat  . A bad rash all over body  . Dizziness and weakness   Immunizations Administered    Name Date Dose VIS Date Route   Pfizer COVID-19 Vaccine 07/03/2019  4:48 PM 0.3 mL 03/19/2019 Intramuscular   Manufacturer: [place]   Lot: [id number]   [place]: [id number]

## 2019-07-07 ENCOUNTER — Ambulatory Visit: Payer: 59

## 2019-12-07 ENCOUNTER — Telehealth: Payer: Self-pay | Admitting: Orthopaedic Surgery

## 2019-12-07 NOTE — Telephone Encounter (Signed)
Patient aware that most likely it is a bruise, but if she loses any ROM or strength, she can come in and get an xray

## 2019-12-07 NOTE — Telephone Encounter (Signed)
Pt called stating she thinks she might have bruised her bone but isn't sure if it's appt worthy and would like a CB to further discuss  [phone]

## 2020-02-03 ENCOUNTER — Ambulatory Visit: Payer: 59

## 2020-02-04 ENCOUNTER — Ambulatory Visit: Payer: 59 | Attending: Internal Medicine

## 2020-02-04 DIAGNOSIS — Z23 Encounter for immunization: Secondary | ICD-10-CM

## 2020-02-04 NOTE — Progress Notes (Signed)
   Covid-19 Vaccination Clinic  Name:  Anu Stagner    MRN: [id number] DOB: 10-Feb-1966  02/04/2020  Ms. Carreon was observed post Covid-19 immunization for 15 minutes without incident. She was provided with Vaccine Information Sheet and instruction to access the V-Safe system.   Ms. Henne was instructed to call 911 with any severe reactions post vaccine: Marland Kitchen Difficulty breathing  . Swelling of face and throat  . A fast heartbeat  . A bad rash all over body  . Dizziness and weakness

## 2020-02-24 ENCOUNTER — Encounter: Payer: Self-pay | Admitting: Gastroenterology

## 2020-03-15 ENCOUNTER — Encounter: Payer: Self-pay | Admitting: Gastroenterology

## 2020-03-15 ENCOUNTER — Ambulatory Visit (INDEPENDENT_AMBULATORY_CARE_PROVIDER_SITE_OTHER): Payer: 59 | Admitting: Gastroenterology

## 2020-03-15 VITALS — BP 112/70 | HR 73 | Ht 69.0 in | Wt 183.0 lb

## 2020-03-15 DIAGNOSIS — Z8371 Family history of colonic polyps: Secondary | ICD-10-CM

## 2020-03-15 DIAGNOSIS — Z800 Family history of malignant neoplasm of digestive organs: Secondary | ICD-10-CM

## 2020-03-15 NOTE — Progress Notes (Signed)
Review of pertinent gastrointestinal problems: 1.  Family history colon cancer, father diagnosed with colon cancer around age 54.  Colonoscopy November 2017 found diverticulosis throughout the colon.  The examination was otherwise normal.  Colonoscopy September 2012 Dr. Ardis Hughs found diverticulosis in the single subcentimeter hyperplastic polyp.   HPI: This is a very pleasant 54 year old woman whom I last saw about 4 years ago the time of a colonoscopy.  See those results summarized above  Current [place] multitask force recommendations for colon cancer screening, polyp surveillance state that unless there is clear evidence that a first-degree relative had a "advanced polyp" it should be assumed that this was not an advanced polyp and the patient should be risk stratified appropriately.  She herself is having no issues with her bowels.  No bleeding, no changes, no serious abdominal pain  Her brother had a colonoscopy last month in [place] and was told he had a precancerous colon polyp and that he would need a repeat colonoscopy at 5-year interval.  While here in the office she got another phone and he was able to prescribe screenshots of this path report stating that he had a 0.1 cm adenoma without high-grade dysplasia removed from his colon   ROS: complete GI ROS as described in HPI, all other review negative.  Constitutional:  No unintentional weight loss   Past Medical History:  Diagnosis Date  . Allergy    sulfer dioxide  . Pain in Achilles tendon    bilateral  . TMJ (dislocation of temporomandibular joint)     Past Surgical History:  Procedure Laterality Date  . COLONOSCOPY    . DILATION AND CURETTAGE OF UTERUS  2010 &2005  . KNEE ARTHROSCOPY  1990   left    No current outpatient medications on file.   Current Facility-Administered Medications  Medication Dose Route Frequency Provider Last Rate Last Admin  . 0.9 %  sodium chloride infusion  500 mL Intravenous Continuous  Milus Banister, MD        Allergies as of 03/15/2020 - Review Complete 03/15/2020  Allergen Reaction Noted  . Sulfur dioxide Other (See Comments) 12/25/2015    Family History  Problem Relation Age of Onset  . Colon cancer Father 64  . Colon polyps Brother     Social History   Socioeconomic History  . Marital status: Married    Spouse name: Not on file  . Number of children: Not on file  . Years of education: Not on file  . Highest education level: Not on file  Occupational History  . Not on file  Tobacco Use  . Smoking status: Never Smoker  . Smokeless tobacco: Never Used  Substance and Sexual Activity  . Alcohol use: Yes    Comment: 1-2 glasses wine per month  . Drug use: No  . Sexual activity: Not on file  Other Topics Concern  . Not on file  Social History Narrative  . Not on file   Social Determinants of Health   Financial Resource Strain:   . Difficulty of Paying Living Expenses: Not on file  Food Insecurity:   . Worried About Charity fundraiser in the Last Year: Not on file  . Ran Out of Food in the Last Year: Not on file  Transportation Needs:   . Lack of Transportation (Medical): Not on file  . Lack of Transportation (Non-Medical): Not on file  Physical Activity:   . Days of Exercise per Week: Not on file  . Minutes  of Exercise per Session: Not on file  Stress:   . Feeling of Stress : Not on file  Social Connections:   . Frequency of Communication with Friends and Family: Not on file  . Frequency of Social Gatherings with Friends and Family: Not on file  . Attends Religious Services: Not on file  . Active Member of Clubs or Organizations: Not on file  . Attends Archivist Meetings: Not on file  . Marital Status: Not on file  Intimate Partner Violence:   . Fear of Current or Ex-Partner: Not on file  . Emotionally Abused: Not on file  . Physically Abused: Not on file  . Sexually Abused: Not on file     Physical Exam: BP 112/70    Pulse 73   Ht 5\' 9"  (1.753 m)   Wt 183 lb (83 kg)   BMI 27.02 kg/m  Constitutional: generally well-appearing Psychiatric: alert and oriented x3 Abdomen: soft, nontender, nondistended, no obvious ascites, no peritoneal signs, normal bowel sounds No peripheral edema noted in lower extremities  Assessment and plan: 54 y.o. female with family history colon cancer, family history colon polyps  Her father had colon cancer, diagnosed when he was in his upper 54s.  Her brother had a colonoscopy last month and was told he had precancerous colon polyp.  She called him on the phone try to get more detail to determine if this was an "advanced adenoma"  Recent [place] multitask force document on colon cancer screening, polyp surveillance suggest that first-degree relative with a history of "advanced adenoma" should be treated the same as family history of patient with colon cancer.  The caveat was that in absence of definitive evidence of "advanced adenoma" but she assumed that the polyp was not an advanced adenoma.  Her brother had a 0.1 cm adenoma without high-grade dysplasia, confirmed on recent path report.  Her father had colon cancer in his 88s.  I think those 2 facts probably increase her risk enough to warrant increased screening.  We will put her in for recall colonoscopy 5 years from her last 1 which would be November 2022.  She understands that if she has significant GI symptoms prior to then she should call anytime.  Please see the "Patient Instructions" section for addition details about the plan.  Owens Loffler, MD [facility] 03/15/2020, 2:39 PM   Total time on date of encounter was 40 minutes (this included time spent preparing to see the patient reviewing records; obtaining and/or reviewing separately obtained history; performing a medically appropriate exam and/or evaluation; counseling and educating the patient and family if present; ordering medications, tests or procedures if  applicable; and documenting clinical information in the health record).

## 2020-03-15 NOTE — Patient Instructions (Signed)
If you are age 54 or older, your body mass index should be between 23-30. Your Body mass index is 27.02 kg/m. If this is out of the aforementioned range listed, please consider follow up with your Primary Care Provider.  If you are age 40 or younger, your body mass index should be between 19-25. Your Body mass index is 27.02 kg/m. If this is out of the aformentioned range listed, please consider follow up with your Primary Care Provider.    You will be due for a recall colonoscopy in 02/2021. We will send you a reminder in the mail when it gets closer to that time.  It was great seeing you today!  Thank you for entrusting me with your care and choosing [facility].  Dr. Ardis Hughs

## 2020-04-11 ENCOUNTER — Encounter: Payer: Self-pay | Admitting: Neurology

## 2020-04-11 ENCOUNTER — Other Ambulatory Visit: Payer: Self-pay | Admitting: Neurology

## 2020-04-12 ENCOUNTER — Encounter: Payer: Self-pay | Admitting: Neurology

## 2020-04-12 ENCOUNTER — Ambulatory Visit (INDEPENDENT_AMBULATORY_CARE_PROVIDER_SITE_OTHER): Payer: 59 | Admitting: Neurology

## 2020-04-12 VITALS — BP 126/85 | HR 63 | Ht 69.0 in | Wt 184.0 lb

## 2020-04-12 DIAGNOSIS — R0683 Snoring: Secondary | ICD-10-CM

## 2020-04-12 DIAGNOSIS — M26609 Unspecified temporomandibular joint disorder, unspecified side: Secondary | ICD-10-CM | POA: Diagnosis not present

## 2020-04-12 NOTE — Progress Notes (Signed)
SLEEP MEDICINE CLINIC    Provider:  Melvyn Novas, MD  Primary Care Physician:  Rodrigo Ran, MD [address] [place] [address]     Referring Provider: Rodrigo Ran, Md [address] [place],  [place] [address]          Chief Complaint according to patient   Patient presents with:    . New Patient (Initial Visit)           HISTORY OF PRESENT ILLNESS:  Katelyn Wagner is a 55 year old Caucasian female patient,  seen on 04/12/2020 from Dr Waynard Edwards.  Chief concern according to patient : " I am not sleepy but my children reports that I snore loudly"    I have the pleasure of seeing Katelyn Wagner , a right -handed female with  has a medical history of Allergy, Pain in Achilles tendon, tendonitis, and  Bilateral TMJ (dislocation of temporomandibular joint).   4 teeth were extracted to fit braces, causing a misalignment .    Sleep relevant medical history: Sinusitis, recurrent, cervical spine whiplash.  Bilateral TMJ (dislocation of temporomandibular joint).  4 teeth were extracted to fit braces, causing a misalignment .   Family medical /sleep history: No other family member on CPAP with OSA, insomnia, sleep walkers.    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 2 teenage daughters and spouse. Tobacco use; none .  ETOH ASN:KNLZ, Caffeine intake in form of [employer] chocolate latte. Regular exercise in form of walking.  achilles tendonitis   Hobbies :some dancing, no longer swimming. COVID ; fully vaccinated.      Sleep habits are as follows: The patient's dinner time is between  4=5PM. The patient goes to bed at 12.30 AM and continues to sleep for 6-7 hours.   The preferred sleep position is on her sides, with the support of 1 pillow. Dreams are reportedly frequent.  7 AM is the usual rise time. The patient wakes up spontaneously.  She reports not feeling refreshed or restored in AM, with symptoms  such as  morning headaches, clenching teeth causes jaw pain and tension. ,Naps are not taken , or only 20 minutes every 14 days.    Review of Systems: Out of a complete 14 system review, the patient complains of only the following symptoms, and all other reviewed systems are negative.:  Weight gain of 15 pounds - snoring   How likely are you to doze in the following situations: 0 = not likely, 1 = slight chance, 2 = moderate chance, 3 = high chance   Sitting and Reading? Watching Television? Sitting inactive in a public place (theater or meeting)? As a passenger in a car for an hour without a break? Lying down in the afternoon when circumstances permit? Sitting and talking to someone? Sitting quietly after lunch without alcohol? In a car, while stopped for a few minutes in traffic?   Total = 1/ 24 points   FSS endorsed at 19/ 63 points.   Social History   Socioeconomic History  . Marital status: Married    Spouse name: Not on file  . Number of children: Not on file  . Years of education: Not on file  . Highest education level: Not on file  Occupational History  . Not on file  Tobacco Use  . Smoking status: Never Smoker  . Smokeless tobacco: Never Used  Substance and Sexual  Activity  . Alcohol use: Yes    Comment: 1-2 glasses wine per month  . Drug use: No  . Sexual activity: Not on file  Other Topics Concern  . Not on file  Social History Narrative  . Not on file   Social Determinants of Health   Financial Resource Strain: Not on file  Food Insecurity: Not on file  Transportation Needs: Not on file  Physical Activity: Not on file  Stress: Not on file  Social Connections: Not on file    Family History  Problem Relation Age of Onset  . Colon cancer Father 49  . Dementia Mother   . Colon polyps Brother     Past Medical History:  Diagnosis Date  . Allergy    sulfer dioxide  . Pain in Achilles tendon    bilateral  . TMJ (dislocation of temporomandibular  joint)     Past Surgical History:  Procedure Laterality Date  . COLONOSCOPY    . DILATION AND CURETTAGE OF UTERUS  2010 &2005  . KNEE ARTHROSCOPY  1990   left  . WISDOM TOOTH EXTRACTION       No current outpatient medications on file prior to visit.   Current Facility-Administered Medications on File Prior to Visit  Medication Dose Route Frequency Provider Last Rate Last Admin  . 0.9 %  sodium chloride infusion  500 mL Intravenous Continuous Milus Banister, MD        Allergies  Allergen Reactions  . Sulfur Dioxide Other (See Comments)    "throat tighten"    Physical exam:  Today's Vitals   04/12/20 0851  BP: 126/85  Pulse: 63  Weight: 184 lb (83.5 kg)  Height: 5\' 9"  (1.753 m)   Body mass index is 27.17 kg/m.   Wt Readings from Last 3 Encounters:  04/12/20 184 lb (83.5 kg)  03/15/20 183 lb (83 kg)  03/04/16 164 lb (74.4 kg)     Ht Readings from Last 3 Encounters:  04/12/20 5\' 9"  (1.753 m)  03/15/20 5\' 9"  (1.753 m)  03/04/16 5' 8.5" (1.74 m)      General: The patient is awake, alert and appears not in acute distress. The patient is well groomed. Head: Normocephalic, atraumatic. Neck is supple. Mallampati 2 ,  neck circumference:15.5  inches . Nasal airflow barely patent. Crossbite noted.  Dental status:  Intact Cardiovascular:  Regular rate and cardiac rhythm by pulse,  without distended neck veins. Respiratory: Lungs are clear to auscultation.  Skin:  Without evidence of ankle edema, or rash. Trunk: The patient's posture is erect.   Neurologic exam : The patient is awake and alert, oriented to place and time.   Memory subjective described as intact.  Attention span & concentration ability appears normal.  Speech is fluent,  without  dysarthria, dysphonia or aphasia.  Mood and affect are appropriate.   Cranial nerves: no loss of smell or taste reported  Pupils are equal and briskly reactive to light.  Funduscopic exam deferred.  Extraocular  movements in vertical and horizontal planes were intact and without nystagmus. No Diplopia. Visual fields by finger perimetry are intact. Hearing was intact to soft voice and finger rubbing.    Facial sensation intact to fine touch.  Facial motor strength is symmetric and tongue and uvula move midline.  Neck ROM : rotation, tilt and flexion extension were normal for age and shoulder shrug was symmetrical.    Motor exam:  Symmetric bulk, tone and ROM.   Normal tone  without cog- wheeling, symmetric grip strength .   Sensory:  Fine touch and vibration were tested, vibration was absent in the big toes, present at the ankles.  Proprioception tested in the upper extremities was normal.   Coordination: Rapid alternating movements in the fingers/hands were of normal speed.  The Finger-to-nose maneuver was intact without evidence of ataxia, dysmetria or tremor.   Gait and station: Patient could rise unassisted from a seated position, walked without assistive device.  Stance is of normal width/ base and the patient turned with 3 steps.  Toe and heel walk were deferred.  Deep tendon reflexes: in the  upper and lower extremities are symmetric and intact.  Babinski response was deferred .      After spending a total time of  40 minutes face to face and additional time for physical and neurologic examination, review of laboratory studies,  personal review of imaging studies, reports and results of other testing and review of referral information / records as far as provided in visit, I have established the following assessments:  1) Katelyn Wagner has a remote history of insomnia which has improved, she is not hypersomniac.  She reports getting between 6 and 7 hours of sleep usually she rarely takes daytime naps.  Overall the main concern seems to be snoring but there have never been apneas witnessed.  Blood pressure here was in normal range the patient's BMI is 27.  She reported a weight gain of about 15  pounds through the pandemic and some of her exercise routines changed in response to pandemic shutdown.  In order to distinguish between the presence of obstructive sleep apnea and simple snoring I can use a home sleep test rather than inviting her for an attended sleep study.  So I will put her on the wait list for a home sleep test by March Pap, and I expect that it would take about 14 days before she is called up to pick up her device and return of the next business day.  I also advised the patient that if the night was not representative for any reason for normal sleep she should let us know so that we just repeat the study instead of interpreting data that may not be reliable.    20 there was a loss of vibration sense in the big toes.  3) crossbite and TMJ.     My Plan is to proceed with:  1) HST   I would like to thank  Crist Infante, [address],  [place] [address] for allowing me to meet with and to take care of this pleasant patient.     I plan to follow up either personally or through our NP within 2-3 month.   CC: I will share my notes with  Dr. Joylene Draft.   Electronically signed by: Larey Seat, MD 04/12/2020 9:09 AM  [facility] Board certified by The [employer] of Sleep Medicine and Diplomate of the [employer] of Sleep Medicine. Board certified In Neurology through the [employer], Fellow of the [employer] of Neurology. Medical Director of [facility].

## 2020-04-12 NOTE — Patient Instructions (Signed)

## 2020-11-13 ENCOUNTER — Other Ambulatory Visit: Payer: Self-pay

## 2020-11-13 ENCOUNTER — Ambulatory Visit (INDEPENDENT_AMBULATORY_CARE_PROVIDER_SITE_OTHER): Payer: 59 | Admitting: Podiatry

## 2020-11-13 DIAGNOSIS — M7662 Achilles tendinitis, left leg: Secondary | ICD-10-CM

## 2020-11-13 DIAGNOSIS — M216X2 Other acquired deformities of left foot: Secondary | ICD-10-CM | POA: Diagnosis not present

## 2020-11-13 DIAGNOSIS — M216X1 Other acquired deformities of right foot: Secondary | ICD-10-CM | POA: Diagnosis not present

## 2020-11-13 DIAGNOSIS — M7661 Achilles tendinitis, right leg: Secondary | ICD-10-CM | POA: Diagnosis not present

## 2020-11-13 NOTE — Patient Instructions (Signed)

## 2020-11-20 ENCOUNTER — Ambulatory Visit (INDEPENDENT_AMBULATORY_CARE_PROVIDER_SITE_OTHER): Payer: 59 | Admitting: Neurology

## 2020-11-20 DIAGNOSIS — R0683 Snoring: Secondary | ICD-10-CM

## 2020-11-20 DIAGNOSIS — M26609 Unspecified temporomandibular joint disorder, unspecified side: Secondary | ICD-10-CM

## 2020-11-20 DIAGNOSIS — G4733 Obstructive sleep apnea (adult) (pediatric): Secondary | ICD-10-CM | POA: Diagnosis not present

## 2020-11-20 DIAGNOSIS — G4763 Sleep related bruxism: Secondary | ICD-10-CM

## 2020-11-20 NOTE — Progress Notes (Signed)
Subjective: 55 year old female presents the office today for evaluation of Achilles tendinitis.  I saw her in 2020 for orthotics and she wants to get new inserts.  Since her last saw her she has seen other providers well and Nitropatch was ordered but she was has not needed this.  She has been doing stretching, icing.  She states is not unbearable but she wants to have it evaluated.  No recent injury or changes otherwise since I last saw her. Denies any systemic complaints such as fevers, chills, nausea, vomiting. No acute changes since last appointment, and no other complaints at this time.   Objective: AAO x3, NAD DP/PT pulses palpable bilaterally, CRT less than 3 seconds There is mild tenderness palpation on the mid substance the Achilles tendon and along the insertion of the posterior calcaneus.  Thompson test is negative.  There is no edema, erythema.  No deficit noted in the Achilles tendon.  Mild thickening of the tendon palpable.  No open lesions or pre-ulcerative lesions. Equinus present. MMT 5/5.  No pain with calf compression, swelling, warmth, erythema  Assessment: Bilateral Achilles tendinitis  Plan: -All treatment options discussed with the patient including all alternatives, risks, complications.  -She was measured for new orthotics today.  Continue stretching, icing daily.  Discussed shoes and better arch supports in general.  Discussed that we can go to PT if needed but she states her insurance is about to change.  -Patient encouraged to call the office with any questions, concerns, change in symptoms.   Trula Slade DPM

## 2020-11-27 NOTE — Progress Notes (Signed)
[facility] TEST REPORT ( by Watch PAT)   STUDY DATE:  11-27-2020 DOB:  Aug 24, 1965 MRN: [id number]   ORDERING CLINICIAN: Larey Seat, MD  REFERRING CLINICIAN: Crist Infante, MD   CLINICAL INFORMATION/HISTORY: Katelyn Wagner is a 55 year- old Caucasian female patient,  who was seen on 04/12/2020 upon request from Dr Joylene Draft.  Chief concern according to patient : " I am not sleepy but my children report that I snore loudly"    I have the pleasure of seeing Katelyn Wagner , a right -handed female who has a medical history of Allergy, Pain in Achilles tendon, tendonitis, and Bilateral TMJ (dislocation of temporomandibular joint).   4 teeth were extracted to fit braces, causing a misalignment , Sinusitis, recurrent, and cervical spine whiplash injury.      Epworth sleepiness score:1 /24.   BMI: 27 kg/m   Neck Circumference: 15"   FINDINGS:   Sleep Summary:   Total Recording Time (hours, min): Total recording time for the study was 7 hours 12 minutes of which 5 hours and 46 minutes with a total sleep time.  Of this total sleep time 29.6% were in REM sleep.      Respiratory Indices:   Calculated pAHI (per hour): The calculated AHI, also known as apnea hypopnea index, was 19.6/h and during REM sleep 41.6/h in non-REM sleep 10/h.  The patient slept mostly in prone position with an AHI of 19.8, supine position with an AHI of 14.9/h and left lateral position with an AHI of 22.8/h.                                                                 Oxygen Saturation Statistics:     O2 Saturation Range (%): Oxygen saturation ranged between 86% at nadir and a maximum of 99%.  The mean oxygen saturation was 93%.  Total time below 89% was 1.2 minutes.                                    Pulse Rate Statistics:   Pulse Range:  Sleep time pulse rate range between 40 and 80 bpm with a mean pulse rate of 57 bpm.              IMPRESSION:  This HST confirms the  presence of obstructive sleep apnea.  The overall AHI at 19.6/h indicates a mild to moderate degree.  Clearly REM sleep dependent.   (Looking back at the sleep position during REM sleep the patient slept either prone or on her left side.   This explains that there was no supine exacerbation of her apnea hypopnea index).   RECOMMENDATION: I would recommend CPAP treatment for this patient but with her underlying temporomandibular joint disorder we have to be careful with this finding the right interface to not exacerbate her problems.  The patient does have a lot of jaw clenching and bruxism but I doubt that her apnea will be sufficiently treated by a dental device only.  I would like to proceed and order an auto CPAP machine with a setting between 5 and 15 cmH2O, 2 cm EPR and a nasal pillow  interface.  Heated humidification will be provided.    INTERPRETING PHYSICIAN:   Larey Seat, MD   Medical Director of [facility] at [facility].

## 2020-11-30 NOTE — Procedures (Signed)
[facility] TEST REPORT ( by Watch PAT)   STUDY DATE:  11-27-2020 DOB:  1965-07-15 MRN: [id number]   ORDERING CLINICIAN: Larey Seat, MD  REFERRING CLINICIAN: Crist Infante, MD   CLINICAL INFORMATION/HISTORY: Katelyn Wagner is a 55 year- old Caucasian female patient,  who was seen on 04/12/2020 upon request from Dr Joylene Draft.  Chief concern according to patient : " I am not sleepy but my children report that I snore loudly"    I have the pleasure of seeing Katelyn Wagner , a right -handed female who has a medical history of Allergy, Pain in Achilles tendon, tendonitis, and Bilateral TMJ (dislocation of temporomandibular joint).   4 teeth were extracted to fit braces, causing a misalignment , Sinusitis, recurrent, and cervical spine whiplash injury.      Epworth sleepiness score:1 /24.   BMI: 27 kg/m   Neck Circumference: 15"   FINDINGS:   Sleep Summary:   Total Recording Time (hours, min): Total recording time for the study was 7 hours 12 minutes of which 5 hours and 46 minutes with a total sleep time.  Of this total sleep time 29.6% were in REM sleep.      Respiratory Indices:   Calculated pAHI (per hour): The calculated AHI, also known as apnea hypopnea index, was 19.6/h and during REM sleep 41.6/h in non-REM sleep 10/h.  The patient slept mostly in prone position with an AHI of 19.8, supine position with an AHI of 14.9/h and left lateral position with an AHI of 22.8/h.                                                                 Oxygen Saturation Statistics:     O2 Saturation Range (%): Oxygen saturation ranged between 86% at nadir and a maximum of 99%.  The mean oxygen saturation was 93%.  Total time below 89% was 1.2 minutes.                                    Pulse Rate Statistics:   Pulse Range:  Sleep time pulse rate range between 40 and 80 bpm with a mean pulse rate of 57 bpm.              IMPRESSION:  This HST confirms the  presence of obstructive sleep apnea.  The overall AHI at 19.6/h indicates a mild to moderate degree.  Clearly REM sleep dependent.   (Looking back at the sleep position during REM sleep the patient slept either prone or on her left side.   This explains that there was no supine exacerbation of her apnea hypopnea index).   RECOMMENDATION: I would recommend CPAP treatment for this patient but with her underlying temporomandibular joint disorder we have to be careful with this finding the right interface to not exacerbate her problems.  The patient does have a lot of jaw clenching and bruxism but I doubt that her apnea will be sufficiently treated by a dental device only.  I would like to proceed and order an auto CPAP machine with a setting between 5 and 15 cmH2O, 2 cm EPR and a nasal pillow interface.  Heated humidification will be provided.    INTERPRETING PHYSICIAN:   Larey Seat, MD   Medical Director of [facility] at [facility].

## 2020-11-30 NOTE — Addendum Note (Signed)
Addended by: Larey Seat on: 11/30/2020 12:54 PM   Modules accepted: Orders

## 2020-11-30 NOTE — Progress Notes (Signed)
IMPRESSION:  This HST confirms the presence of obstructive sleep apnea.  The overall AHI at 19.6/h indicates a mild to moderate degree.  Clearly REM sleep dependent.   (Looking back at the sleep position during REM sleep the patient slept either prone or on her left side.   This explains that there was no supine exacerbation of her apnea hypopnea index).  RECOMMENDATION: I would recommend CPAP treatment for this patient but with her underlying temporomandibular joint disorder we have to be careful with this finding the right interface to not exacerbate her problems.  The patient does have a lot of jaw clenching and bruxism but I doubt that her apnea will be sufficiently treated by a dental device only.  I would like to proceed and order an auto CPAP machine with a setting between 5 and 15 cmH2O, 2 cm EPR and a nasal pillow interface.  Heated humidification will be provided.

## 2020-12-04 ENCOUNTER — Telehealth: Payer: Self-pay | Admitting: Neurology

## 2020-12-04 ENCOUNTER — Telehealth: Payer: Self-pay | Admitting: Podiatry

## 2020-12-04 NOTE — Telephone Encounter (Signed)
-----   Message from Larey Seat, MD sent at 11/30/2020 12:54 PM EDT ----- IMPRESSION:  This HST confirms the presence of obstructive sleep apnea.  The overall AHI at 19.6/h indicates a mild to moderate degree.  Clearly REM sleep dependent.   (Looking back at the sleep position during REM sleep the patient slept either prone or on her left side.   This explains that there was no supine exacerbation of her apnea hypopnea index).  RECOMMENDATION: I would recommend CPAP treatment for this patient but with her underlying temporomandibular joint disorder we have to be careful with this finding the right interface to not exacerbate her problems.  The patient does have a lot of jaw clenching and bruxism but I doubt that her apnea will be sufficiently treated by a dental device only.  I would like to proceed and order an auto CPAP machine with a setting between 5 and 15 cmH2O, 2 cm EPR and a nasal pillow interface.  Heated humidification will be provided.

## 2020-12-04 NOTE — Telephone Encounter (Signed)
Called the pt back. There was no answer.Katelyn Wagner LVM asking pt to call back.

## 2020-12-04 NOTE — Telephone Encounter (Signed)
Pt returning phone call from [facility]

## 2020-12-04 NOTE — Telephone Encounter (Signed)
Orthotics in.. pt aware ok to pick up.. 

## 2020-12-05 NOTE — Telephone Encounter (Signed)
I called pt. I advised pt that Dr. Brett Fairy reviewed their sleep study results and found that pt has moderate sleep apnea. Dr. Brett Fairy recommends that pt starts auto CPAP. I reviewed PAP compliance expectations with the pt. Pt is agreeable to starting a CPAP. I advised pt that an order will be sent to a DME, [facility], and [facility] will call the pt within about one week after they file with the pt's insurance. [facility] will show the pt how to use the machine, fit for masks, and troubleshoot the CPAP if needed. A follow up appt was made for insurance purposes with Dr. Brett Fairy on 03/05/2021 at 1:30 pm. Pt verbalized understanding to arrive 15 minutes early and bring their CPAP. A letter with all of this information in it will be mailed to the pt as a reminder. I verified with the pt that the address we have on file is correct. Pt verbalized understanding of results. Pt had no questions at this time but was encouraged to call back if questions arise. I have sent the order to [facility] and have received confirmation that they have received the order.

## 2020-12-07 ENCOUNTER — Encounter: Payer: Self-pay | Admitting: Neurology

## 2020-12-07 ENCOUNTER — Other Ambulatory Visit: Payer: Self-pay | Admitting: Neurology

## 2020-12-07 DIAGNOSIS — R0683 Snoring: Secondary | ICD-10-CM

## 2020-12-07 DIAGNOSIS — G4733 Obstructive sleep apnea (adult) (pediatric): Secondary | ICD-10-CM

## 2020-12-07 NOTE — Telephone Encounter (Signed)
Called the patient and advised that we can attempt an alternate location called [facility]. They are local. Pt is willing to see if they can get her set up sooner.  Order will be sent to [facility].

## 2020-12-07 NOTE — Telephone Encounter (Signed)
Pt has been told it will be about 3 months before she gets her CPAP. Pt would like to discuss the option of buying a CPAP independently.

## 2020-12-13 ENCOUNTER — Telehealth: Payer: Self-pay | Admitting: Podiatry

## 2020-12-13 NOTE — Telephone Encounter (Signed)
Pt left message stating she picked up the new orthotics and they do not appear to have a lift on them like her old ones do.  I returned call and told pt to drop them back off and I will send them back to get them to add the lift. I asked if pt could send me a picture of the old ones and she does not want to do that at this time. The last pair she had made in 2020 had a 1.4 inch lift bilaterally.

## 2020-12-21 ENCOUNTER — Telehealth: Payer: Self-pay | Admitting: Podiatry

## 2020-12-21 NOTE — Telephone Encounter (Signed)
Adjusted orthotics in.. pt aware ok to pick up.Marland KitchenMarland Kitchen

## 2021-03-02 IMAGING — MR MRI LUMBAR SPINE WITHOUT CONTRAST
5 series · 48 of 48 positions shown · non-contrast
Comparison: None.

CLINICAL DATA: Low back pain radiating into the right buttock since
08/18/2018. No known injury.

EXAM:
MRI LUMBAR SPINE WITHOUT CONTRAST
TECHNIQUE: Multiplanar, multisequence MR imaging of the lumbar spine was
performed. No intravenous contrast was administered.

[Series 3: T2 post-contrast · sagittal · 4.0mm · 0.88mm/px · 6 of 12 slices shown]
[im 1/12]
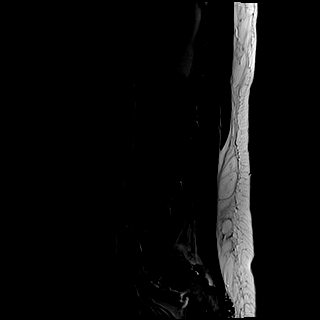
[im 3/12]
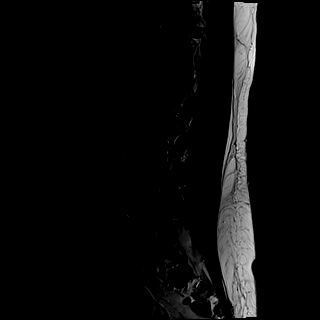
[im 5/12]
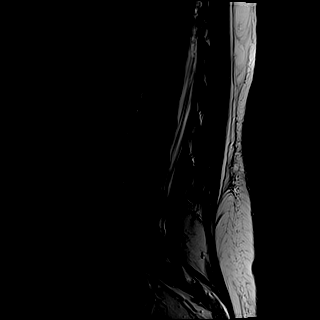
[im 7/12]
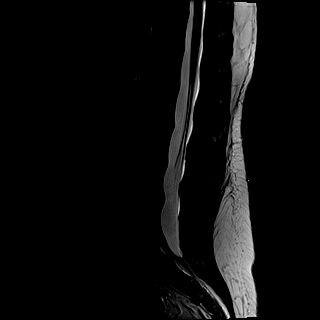
[im 9/12]
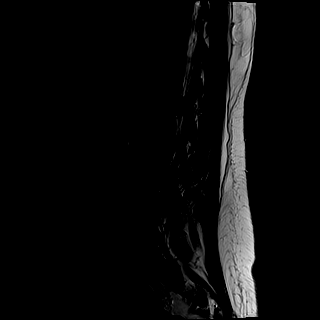
[im 12/12]
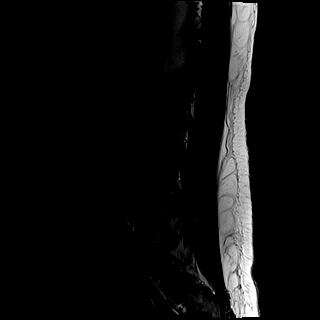

[Series 4: T1 · sagittal · 4.0mm · 0.88mm/px · 5 of 12 slices shown (1 of 2)]
[im 1/12]
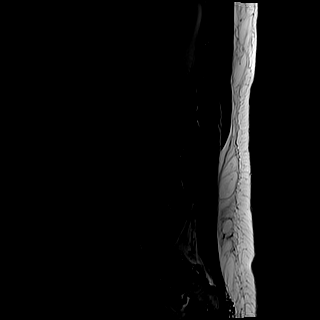
[im 3/12]
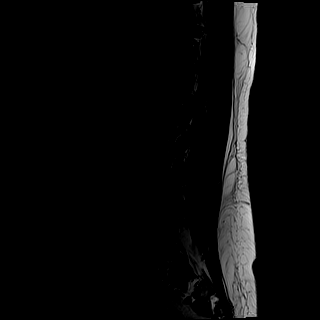
[im 6/12]
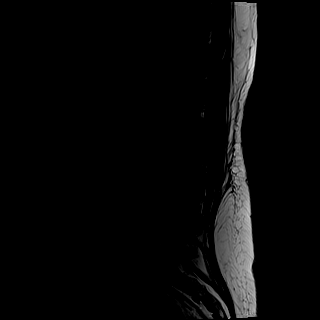
[im 9/12]
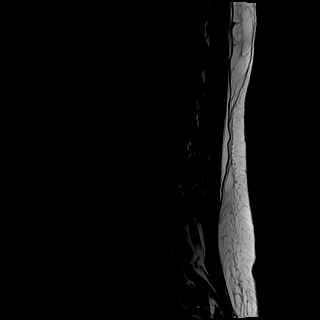
[im 12/12]
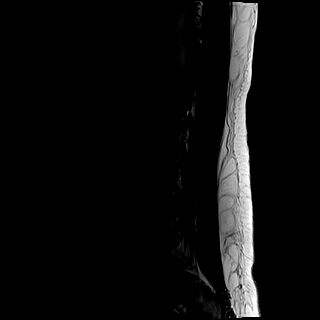

[Series 5: tirm sag · sagittal · 4.0mm · 0.55mm/px · 5 of 12 slices shown]
[im 1/12]
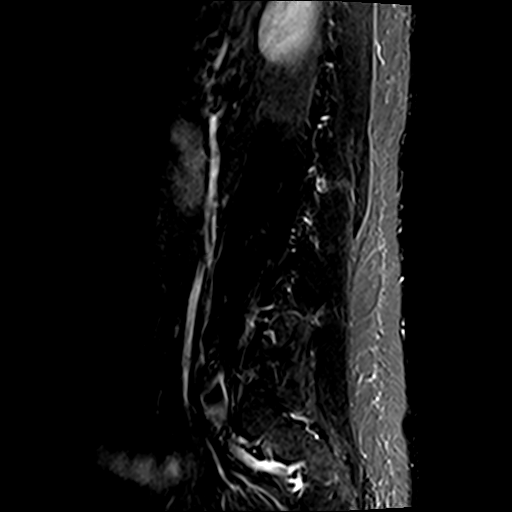
[im 3/12]
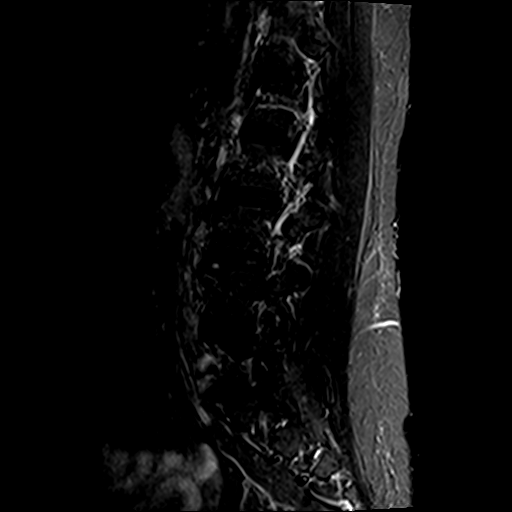
[im 6/12]
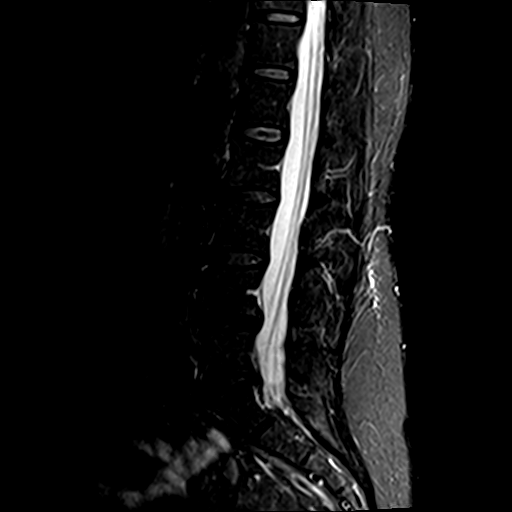
[im 9/12]
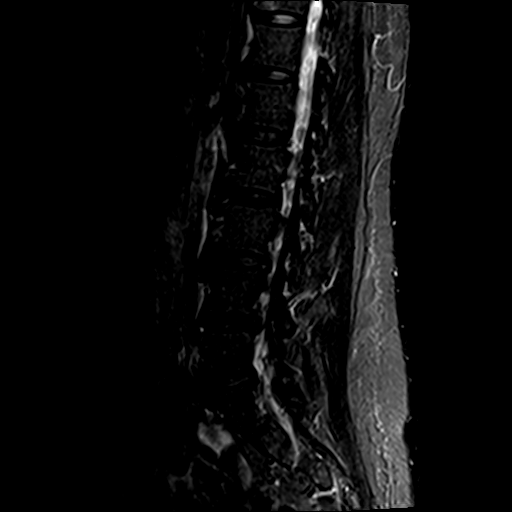
[im 12/12]
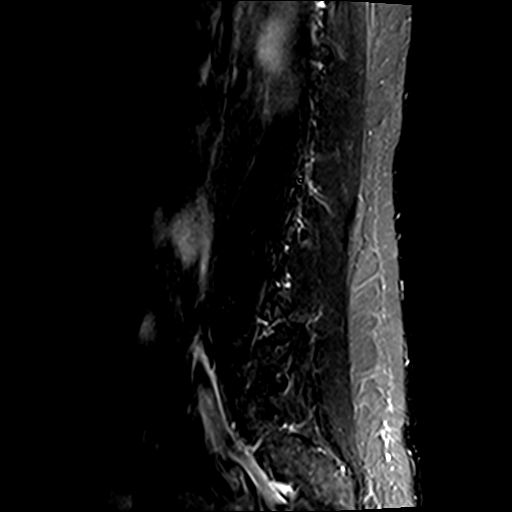

[Series 6: T1 · axial · 4.0mm · 0.78mm/px · z∈[-28,+185]mm · 16 of 36 slices shown (2 of 2)]
[im 1/36]
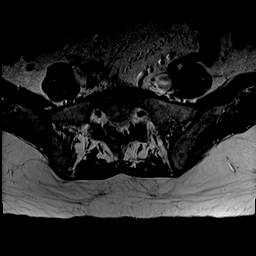
[im 3/36]
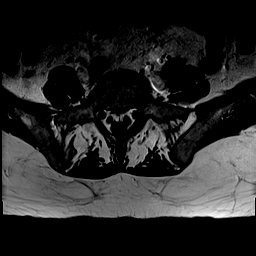
[im 5/36]
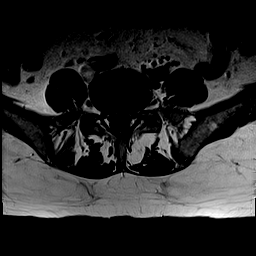
[im 8/36]
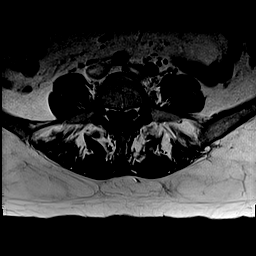
[im 10/36]
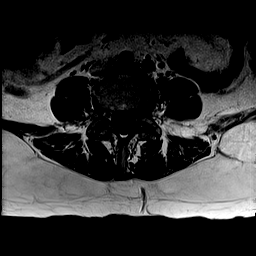
[im 12/36]
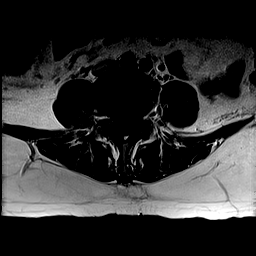
[im 15/36]
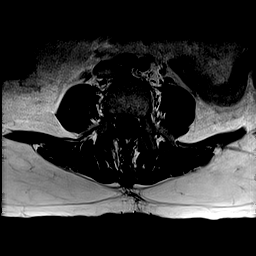
[im 17/36]
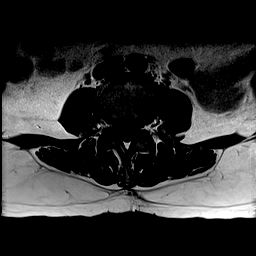
[im 19/36]
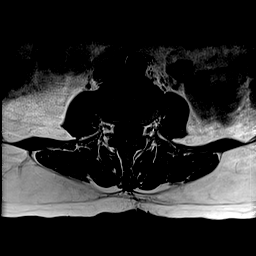
[im 22/36]
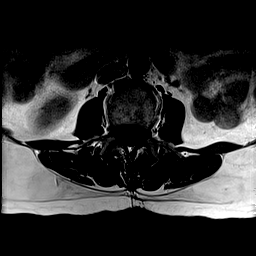
[im 24/36]
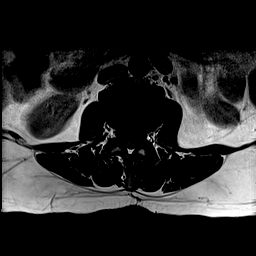
[im 26/36]
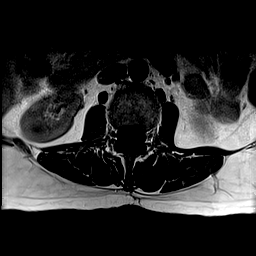
[im 29/36]
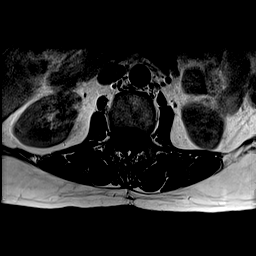
[im 31/36]
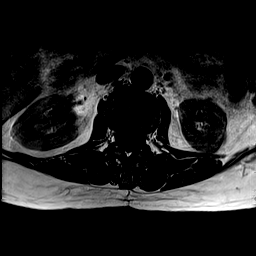
[im 33/36]
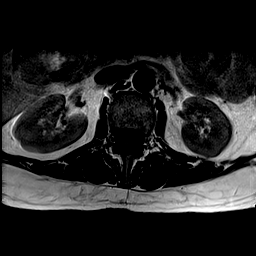
[im 36/36]
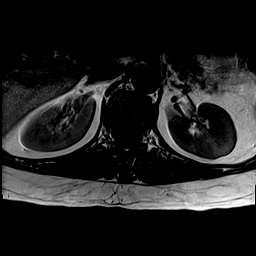

[Series 7: T2 · axial · 4.0mm · 1.04mm/px · z∈[-28,+185]mm · 16 of 36 slices shown]
[im 1/36]
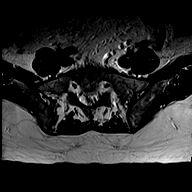
[im 3/36]
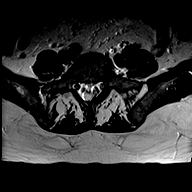
[im 5/36]
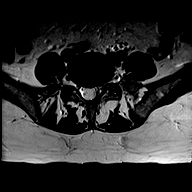
[im 8/36]
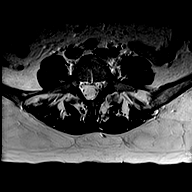
[im 10/36]
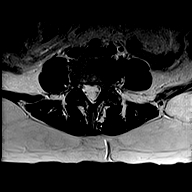
[im 12/36]
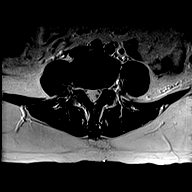
[im 15/36]
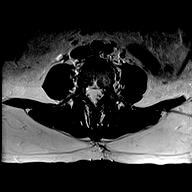
[im 17/36]
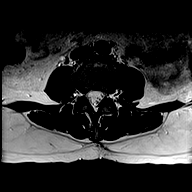
[im 19/36]
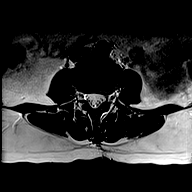
[im 22/36]
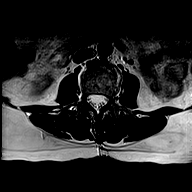
[im 24/36]
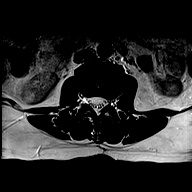
[im 26/36]
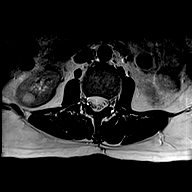
[im 29/36]
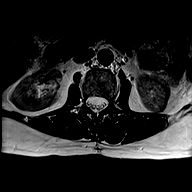
[im 31/36]
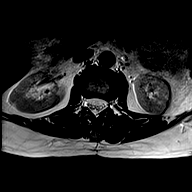
[im 33/36]
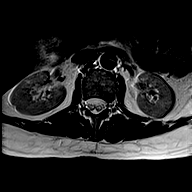
[im 36/36]
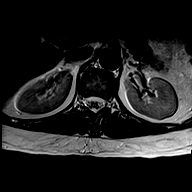

[48 of 48 positions shown; findings below may reference images not displayed]

FINDINGS: Segmentation:  Standard.

Alignment:  Maintained with straightening of lordosis noted.

Vertebrae:  No fracture or worrisome lesion.

Conus medullaris and cauda equina: Conus extends to the L1 level.
Conus and cauda equina appear normal.

Paraspinal and other soft tissues: Small T2 hyperintense lesion
right kidney is likely a cyst. Otherwise negative.

Disc levels:

T11-12 is imaged in the sagittal plane only and negative.

T12-L1: Negative.

L1-2: Negative.

L2-3: Minimal disc bulge without stenosis.

L3-4: Minimal disc bulge without stenosis.

L4-5: Shallow central protrusion without central canal or foraminal
stenosis.

L5-S1: Shallow broad-based central and right paracentral protrusion
without stenosis.
IMPRESSION: Mild degenerative disease as described above without central canal
or foraminal narrowing.

## 2021-03-04 ENCOUNTER — Encounter: Payer: Self-pay | Admitting: Neurology

## 2021-03-05 ENCOUNTER — Encounter: Payer: Self-pay | Admitting: Neurology

## 2021-03-05 ENCOUNTER — Ambulatory Visit (INDEPENDENT_AMBULATORY_CARE_PROVIDER_SITE_OTHER): Payer: 59 | Admitting: Neurology

## 2021-03-05 VITALS — BP 130/85 | HR 81 | Ht 69.0 in | Wt 188.5 lb

## 2021-03-05 DIAGNOSIS — G478 Other sleep disorders: Secondary | ICD-10-CM

## 2021-03-05 DIAGNOSIS — G4733 Obstructive sleep apnea (adult) (pediatric): Secondary | ICD-10-CM

## 2021-03-05 NOTE — Patient Instructions (Signed)
Preventing Consequences of Unhealthy Weight Loss Behaviors, Adult °Reaching and maintaining a healthy weight is important for your overall health. A healthy weight will vary from person to person. It is natural to want to lose weight quickly, using whatever methods seem fastest. However, losing weight in a healthy way is not a quick process. Instead, aim for slow, steady weight loss by making small changes and setting achievable goals. °How can unhealthy weight loss behaviors affect me? °Using unhealthy behaviors to try to lose weight can cause: °Tiredness (fatigue), low heart rate, and low blood pressure. °Imbalances in your body. These may be imbalances in: °Electrolytes. These are salts and minerals in your blood. °Chemicals. These are needed so your body can work properly. °Body fluids. Loss of fluids may lead to dehydration. °Organ damage or organ failure, especially affecting the kidneys. °Thin bones that break easily. °Loneliness or relationship problems with your friends and family. °Emotional problems, including depression and anxiety. °Changing unhealthy weight loss behaviors through lifestyle changes will improve your overall health. Maintaining a healthy weight also lowers your risk of certain conditions, such as: °Obesity. °Heart disease, high cholesterol, and high blood pressure. °Type 2 diabetes. °Breathing and sleeping disorders. °Stroke. °Osteoarthritis. This affects your joints. °Osteoporosis. This affects your bones. °Some cancers. °What can increase my risk? °Certain views or feelings about yourself and certain habits can increase your risk of unhealthy weight loss behaviors. These include: °Having depression and being overweight as an adult. °Attempting weight loss as a child or teen. °Using alcohol, drugs, or tobacco products. °What actions can I take to prevent these behaviors? °You can make certain lifestyle changes to help you lose weight in a healthy way. These include eating nutritious  foods and exercising regularly. °Nutrition ° °Eat a variety of healthy foods, including fruits and vegetables, whole grains, lean proteins, and low-fat dairy products. °Drink water instead of sugary drinks such as juice, soda, or sports drinks. °Drink enough fluid to keep your urine pale yellow. °Plan healthy, balanced meals. Work with a dietitian to make a healthy meal plan that works for you. °Limit the following: °Foods that are high in fat, salt (sodium), or sugar. These include candy, donuts, pizza, and fast foods. °Fried or heavily processed foods. °Lifestyle °Avoid these unhealthy eating habits: °Following a diet that restricts entire types of food. This may be a popular diet that promises extreme results in a short time. °Skipping meals to save calories. °Not eating anything for long periods of time (fasting). °Restricting your calories to far fewer than the number that you need to lose or maintain a healthy weight. °Taking laxative pills to make you have more frequent bowel movements. °Taking medicines to make your body lose excess fluids (diuretics). °Eating an excessive amount of food and then making yourself vomit. This is known as bingeing and purging. °Do not use any products that contain nicotine or tobacco. These products include cigarettes, chewing tobacco, and vaping devices, such as e-cigarettes. If you need help quitting, ask your health care provider. °Alcohol use °Do not drink alcohol if: °Your health care provider tells you not to drink. °You are pregnant, may be pregnant, or are planning to become pregnant. °If you drink alcohol: °Limit how much you have to: °0-1 drink a day for women. °0-2 drinks a day for men. °Know how much alcohol is in your drink. In the [place] one drink equals one 12 oz bottle of beer (355 mL), one 5 oz glass of wine (148 mL), or one 1½   oz glass of hard liquor (44 mL). °Activity ° °Avoid compulsively getting an extreme amount of exercise. °Work with a dietitian to make a  healthy exercise program. °Include different types of exercise in your exercise program, such as strengthening, aerobic, and flexibility exercises. °To maintain your weight, get at least 150 minutes of moderate-intensity exercise each week. Moderate-intensity exercise could be brisk walking or biking. °To lose a healthy amount of weight, get 60 minutes of moderate-intensity exercise each day. °Find ways to reduce stress, such as regular exercise or meditation. °Find a hobby or other activity that you enjoy to distract you from eating when you feel stressed or bored. °Where to find support °For more support, talk with: °Your health care provider or dietitian. Ask about support groups. °A mental health care provider. °Family and friends. °Where to find more information °Learn more about how to prevent complications from unhealthy weight loss behaviors from: °Centers for Disease Control and Prevention: www.cdc.gov °National Institute of Mental Health: www.nimh.nih.gov °National Eating Disorders Association: www.nationaleatingdisorders.org °Contact a health care provider if: °You often feel very tired. °You notice changes in your skin or your hair. °You faint because of dehydration or too much exercise. °You struggle to change your unhealthy weight loss behaviors on your own. °Unhealthy weight loss behaviors are affecting your daily life or your relationships. °You have signs or symptoms of an eating disorder. °You have major weight changes in a short period of time. °You feel guilty or ashamed about eating or exercising. °Summary °Using unhealthy eating behaviors to try to lose weight can cause a variety of physical and emotional problems that affect your overall health and well-being. °Aim for slow, steady weight loss by choosing healthy foods, avoiding unhealthy eating habits, and exercising regularly. °Contact your health care provider if you struggle to change your behaviors on your own or if you think that you may  have an eating disorder. °This information is not intended to replace advice given to you by your health care provider. Make sure you discuss any questions you have with your health care provider. °Document Revised: 11/07/2020 Document Reviewed: 11/07/2020 °Elsevier Patient Education © 2022 [employer] Inc. ° °

## 2021-03-05 NOTE — Progress Notes (Addendum)
[facility]INE [facility]    Provider:  Larey Seat, MD  Primary Care Physician:  Crist Infante, MD [place] [address]     Referring Provider: Crist Infante, [address] [place],  [place] [address]          Chief Complaint according to patient   Patient presents with:     RV on CPAP- difficulties with compliance            HISTORY OF PRESENT ILLNESS:  Katelyn Wagner is a 55 year old Caucasian female patient,  seen on 03/05/2021 from Dr Joylene Draft.   This is a RV after sleep test and with the patient on CPAP therapy. The patient had been seen in her initial consultation on 1-5 2022 and had a sleep test home sleep test on 8-22 2022.  Chief concerns were misalignment related temporomandibular joint pain, frequent sinusitis cervical spine whiplash injury in the past snoring.  The AHI overall was 19.6/h , REM AHI was  41.6 which does constitute severe sleep apnea.  Patient slept mostly in prone position here the AHI was 19.8 on her back 14.9 and in left lateral position 22.8./h  Due to the REM sleep dependency, need to recommend CPAP treatment but was also careful to mention that she has temporomandibular joint disorder and it has to be made sure that her interface would not exacerbate her jaw pain or problems.  She was given an auto titration CPAP machine and her compliance for the last 30 days was only 50% but she had actually used the machine more in the 60 days prior.  She was traveling without her CPAP earlier this month.  When she uses CPAP she only is able to use it for about 2-1/2 hours it seems the minimum pressure is 5 maximum pressure of 15 she uses a 3 cm expiratory pressure relief setting.  There is a mild not even moderate air leakage noted pressure at the 95th percentile is only 7.7 cmH2O so and really low setting and remarkably the patient's AHI apnea hypopnea index on the CPAP is 0.3 so almost complete resolution of apnea with such low pressures.    No aerophagia. I wonder how I can make it more palatable to use CPAP we will also mentioned that her fatigue score was only 14 points today the Epworth sleepiness score was only endorsed at feet 4 points today.     Initial visit - 04-2020. Chief concern according to patient : " I am not sleepy but my children reports that I snore loudly"    I have the pleasure of seeing Katelyn Wagner , a right -handed female with  has a medical history of Allergy, Pain in Achilles tendon, tendonitis, and  Bilateral TMJ (dislocation of temporomandibular joint).   4 teeth were extracted to fit braces, causing a misalignment .    Sleep relevant medical history: Sinusitis, recurrent, cervical spine whiplash.  Bilateral TMJ (dislocation of temporomandibular joint).  4 teeth were extracted to fit braces, causing a misalignment .   Family medical /sleep history: No other family member on CPAP with OSA, insomnia, sleep walkers.    Social history: patient left her husband of 18 years in June 2022, now the stress in her life is less, and the divorce is filed.   Patient is working as self employed-  and lives in a household with 2 persons and 2 dogs. 2 teenage daughters. Tobacco use; none.  ETOH QHU:TMLY, Caffeine  intake in form of [employer] chocolate latte. Regular exercise in form of walking.  achilles tendonitis   Hobbies :some dancing, no longer swimming. COVID ; fully vaccinated.    Sleep habits are as follows: The patient's dinner time is between  4=5PM. The patient goes to bed at 12.30 AM and continues to sleep for 6-7 hours.   The preferred sleep position is on her sides, with the support of 1 pillow. Dreams are reportedly frequent.  7 AM is the usual rise time. The patient wakes up spontaneously.  She reports not feeling refreshed or restored in AM, with symptoms such as  morning headaches, clenching teeth causes jaw pain and tension. ,Naps are not taken , or only 20 minutes every 14 days.    Review of  Systems: Out of a complete 14 system review, the patient complains of only the following symptoms, and all other reviewed systems are negative.:  Weight gain of 15 pounds - snoring   How likely are you to doze in the following situations: 0 = not likely, 1 = slight chance, 2 = moderate chance, 3 = high chance   Sitting and Reading? Watching Television? Sitting inactive in a public place (theater or meeting)? As a passenger in a car for an hour without a break? Lying down in the afternoon when circumstances permit? Sitting and talking to someone? Sitting quietly after lunch without alcohol? In a car, while stopped for a few minutes in traffic?   Total = 1/ 24 points   FSS endorsed at 19/ 63 points.   Social History   Socioeconomic History   Marital status: Married    Spouse name: Not on file   Number of children: Not on file   Years of education: Not on file   Highest education level: Not on file  Occupational History   Not on file  Tobacco Use   Smoking status: Never   Smokeless tobacco: Never  Substance and Sexual Activity   Alcohol use: Yes    Comment: 1-2 glasses wine per month   Drug use: No   Sexual activity: Not on file  Other Topics Concern   Not on file  Social History Narrative   Not on file   Social Determinants of Health   Financial Resource Strain: Not on file  Food Insecurity: Not on file  Transportation Needs: Not on file  Physical Activity: Not on file  Stress: Not on file  Social Connections: Not on file    Family History  Problem Relation Age of Onset   Colon cancer Father 62   Dementia Mother    Colon polyps Brother     Past Medical History:  Diagnosis Date   Allergy    sulfer dioxide   Pain in Achilles tendon    bilateral   TMJ (dislocation of temporomandibular joint)     Past Surgical History:  Procedure Laterality Date   COLONOSCOPY     DILATION AND CURETTAGE OF UTERUS  2010 &2005   KNEE ARTHROSCOPY  1990   left   WISDOM  TOOTH EXTRACTION       No current outpatient medications on file prior to visit.   No current facility-administered medications on file prior to visit.    Allergies  Allergen Reactions   Sulfur Dioxide Other (See Comments)    "throat tighten"    Physical exam:  Today's Vitals   03/05/21 1339  BP: 130/85  Pulse: 81  Weight: 188 lb 8 oz (85.5 kg)  Height:  5\' 9"  (1.753 m)   Body mass index is 27.84 kg/m.   Wt Readings from Last 3 Encounters:  03/05/21 188 lb 8 oz (85.5 kg)  04/12/20 184 lb (83.5 kg)  03/15/20 183 lb (83 kg)     Ht Readings from Last 3 Encounters:  03/05/21 5\' 9"  (1.753 m)  04/12/20 5\' 9"  (1.753 m)  03/15/20 5\' 9"  (1.753 m)      General: The patient is awake, alert and appears not in acute distress. The patient is well groomed. Head: Normocephalic, atraumatic. Neck is supple. Mallampati 2 ,  neck circumference:15.5  inches . Nasal airflow barely patent. Crossbite noted.  Dental status:  Intact Cardiovascular:  Regular rate and cardiac rhythm by pulse,  without distended neck veins. Respiratory: Lungs are clear to auscultation.  Skin:  Without evidence of ankle edema, or rash. Trunk: The patient's posture is erect.   Neurologic exam : The patient is awake and alert, oriented to place and time.   Memory subjective described as intact.  Attention span & concentration ability appears normal.  Speech is fluent,  without  dysarthria, dysphonia or aphasia.  Mood and affect are appropriate. Stressed - custody.    Cranial nerves: no loss of smell or taste reported  Pupils are equal and briskly reactive to light.  Funduscopic exam deferred.  Extraocular movements in vertical and horizontal planes were intact and without nystagmus. No Diplopia. Visual fields by finger perimetry are intact. Hearing was intact to soft voice and finger rubbing.   Facial sensation intact to fine touch.  Facial motor strength is symmetric and tongue and uvula move midline.   Neck ROM : rotation, tilt and flexion extension were normal for age and shoulder shrug was symmetrical.    Motor exam:  Symmetric bulk, tone and ROM.   Normal tone without cog- wheeling, symmetric grip strength .   Sensory:  Vibration was absent in the big toes, present at the ankles.  Proprioception tested in the upper extremities was normal.   Coordination: Rapid alternating movements in the fingers/hands were of normal speed.  The Finger-to-nose maneuver was intact without evidence of ataxia, dysmetria or tremor.   Gait and station: Patient could rise unassisted from a seated position, walked without assistive device.  Stance is of normal width/ base and the patient turned with 3 steps.  Toe and heel walk were deferred.  Deep tendon reflexes: deferred .      After spending a total time of 20 minutes face to face and additional time for physical and neurologic examination, review of laboratory studies,  personal review of imaging studies, reports and results of other testing and review of referral information / records as far as provided in visit, I have established the following assessments:  1) Mrs. Barbier has a remote history of insomnia which has improved, she is not hypersomniac.  She reports getting between 6 and 7 hours of sleep usually she rarely takes daytime naps.   She has some allergies and post nasal drain.  She believes there is no snoring - this  may not be reliable.  No reports of snoring since CPAP was used either.   Its better to use a nasal pillow with retrognathia, crossbite and TMJ.  Its better to use a FFM when nasal allergies prevent airflow.   The patient may need to have a period of acclimatization of CPAP.    My Plan is to proceed with:  1) Offered Trazodone, as a sleep aid.  2) I will ask Dr. Joylene Draft if he would consider her for Ozempic or another weight loss management drug to help reduce the AHI at baseline through weight loss. Intermittent fasting  discussed.   3)give the nasal pillow another chance and if no progress will go to dental device for reduction in AHI rather than untreated AHI.   I would like to thank : Crist Infante, [address] [place],  [place] [address] for allowing me to meet with and to take care of this pleasant patient.   I plan to follow up through our NP within 4 months.   CC: I will share my notes with  Dr. Joylene Draft.   Electronically signed by: Larey Seat, MD 03/05/2021 1:59 PM  [facility] Board certified by The [employer] of Sleep Medicine and Diplomate of the [employer] of Sleep Medicine. Board certified In Neurology through the [place], Fellow of the [employer] of Neurology. Medical Director of [facility].

## 2021-03-29 ENCOUNTER — Encounter: Payer: Self-pay | Admitting: Gastroenterology

## 2021-05-09 ENCOUNTER — Other Ambulatory Visit: Payer: Self-pay

## 2021-05-09 ENCOUNTER — Ambulatory Visit (AMBULATORY_SURGERY_CENTER): Payer: 59 | Admitting: *Deleted

## 2021-05-09 VITALS — Ht 69.0 in | Wt 188.0 lb

## 2021-05-09 DIAGNOSIS — Z8371 Family history of colonic polyps: Secondary | ICD-10-CM

## 2021-05-09 DIAGNOSIS — Z800 Family history of malignant neoplasm of digestive organs: Secondary | ICD-10-CM

## 2021-05-09 MED ORDER — PLENVU 140 G PO SOLR: 1.0000 | Freq: Once | ORAL | 0 refills | Status: AC

## 2021-05-09 MED ORDER — ONDANSETRON HCL 4 MG PO TABS: 4.0000 mg | ORAL_TABLET | ORAL | 0 refills | Status: DC

## 2021-05-09 NOTE — Progress Notes (Signed)
Patient's pre-visit was done today over the phone with the patient. Name,DOB and address verified. Patient denies any allergies to Eggs and Soy. Patient denies any problems with anesthesia/sedation. Patient is not taking any diet pills or blood thinners. No home Oxygen.   Prep instructions sent to pt's email-pt aware. Patient understands to call us back with any questions or concerns. Patient is aware of our care-partner policy and [id number] safety protocol.   The patient is COVID-19 vaccinated.

## 2021-05-22 ENCOUNTER — Encounter: Payer: Self-pay | Admitting: Gastroenterology

## 2021-05-23 ENCOUNTER — Encounter: Payer: Self-pay | Admitting: Gastroenterology

## 2021-05-23 ENCOUNTER — Other Ambulatory Visit: Payer: Self-pay

## 2021-05-23 ENCOUNTER — Ambulatory Visit (AMBULATORY_SURGERY_CENTER): Payer: 59 | Admitting: Gastroenterology

## 2021-05-23 VITALS — BP 103/47 | HR 56 | Temp 98.2°F | Resp 9 | Ht 69.0 in | Wt 188.0 lb

## 2021-05-23 DIAGNOSIS — Z800 Family history of malignant neoplasm of digestive organs: Secondary | ICD-10-CM

## 2021-05-23 DIAGNOSIS — D121 Benign neoplasm of appendix: Secondary | ICD-10-CM

## 2021-05-23 DIAGNOSIS — K635 Polyp of colon: Secondary | ICD-10-CM

## 2021-05-23 DIAGNOSIS — Z1211 Encounter for screening for malignant neoplasm of colon: Secondary | ICD-10-CM

## 2021-05-23 DIAGNOSIS — Z8371 Family history of colonic polyps: Secondary | ICD-10-CM

## 2021-05-23 MED ORDER — SODIUM CHLORIDE 0.9 % IV SOLN: 500.0000 mL | Freq: Once | INTRAVENOUS | Status: DC

## 2021-05-23 NOTE — Progress Notes (Signed)
Report to PACU, RN, vss, BBS= Clear.  

## 2021-05-23 NOTE — Op Note (Signed)
[facility] Patient Name: Katelyn Wagner Procedure Date: 05/23/2021 8:17 AM MRN: [id number] Endoscopist: Milus Banister , MD Age: 56 Referring MD:  Date of Birth: Aug 06, 1965 Gender: Female Account #: [id number] Procedure:                Colonoscopy Indications:              Screening in patient at increased risk: Father had                            colon cancer (70s), brother had precancerous                            polyps: Colonoscopy November 2068found                            diverticulosis throughout the colon. The                            examination was otherwise normal. Colonoscopy                            September 2012Dr. Ardis Hughs found diverticulosis in                            the single subcentimeter hyperplastic polyp Medicines:                Monitored Anesthesia Care Procedure:                Pre-Anesthesia Assessment:                           - Prior to the procedure, a History and Physical                            was performed, and patient medications and                            allergies were reviewed. The patient's tolerance of                            previous anesthesia was also reviewed. The risks                            and benefits of the procedure and the sedation                            options and risks were discussed with the patient.                            All questions were answered, and informed consent                            was obtained. Prior Anticoagulants: The patient has  taken no previous anticoagulant or antiplatelet                            agents. ASA Grade Assessment: II - A patient with                            mild systemic disease. After reviewing the risks                            and benefits, the patient was deemed in                            satisfactory condition to undergo the procedure.                           After obtaining informed consent, the  colonoscope                            was passed under direct vision. Throughout the                            procedure, the patient's blood pressure, pulse, and                            oxygen saturations were monitored continuously. The                            Olympus CF-HQ190L (Serial# [id number]) Colonoscope was                            introduced through the anus and advanced to the the                            cecum, identified by appendiceal orifice and                            ileocecal valve. The colonoscopy was performed                            without difficulty. The patient tolerated the                            procedure well. The quality of the bowel                            preparation was good. The ileocecal valve,                            appendiceal orifice, and rectum were photographed. Scope In: 8:32:26 AM Scope Out: 8:43:19 AM Scope Withdrawal Time: 0 hours 7 minutes 33 seconds  Total Procedure Duration: 0 hours 10 minutes 53 seconds  Findings:                 A 5 mm polyp was found in the appendiceal orifice.  The polyp was sessile. The polyp was removed with a                            cold snare. Resection and retrieval were complete.                           Multiple small and large-mouthed diverticula were                            found in the left colon.                           Internal hemorrhoids were found. The hemorrhoids                            were small.                           The exam was otherwise without abnormality on                            direct and retroflexion views. Complications:            No immediate complications. Estimated blood loss:                            None. Estimated Blood Loss:     Estimated blood loss: none. Impression:               - One 5 mm polyp at the appendiceal orifice,                            removed with a cold snare. Resected and retrieved.                            - Diverticulosis in the left colon.                           - Internal hemorrhoids.                           - The examination was otherwise normal on direct                            and retroflexion views. Recommendation:           - Patient has a contact number available for                            emergencies. The signs and symptoms of potential                            delayed complications were discussed with the                            patient. Return to normal activities tomorrow.  Written discharge instructions were provided to the                            patient.                           - Resume previous diet.                           - Continue present medications.                           - Await pathology results. Milus Banister, MD 05/23/2021 8:46:51 AM This report has been signed electronically.

## 2021-05-23 NOTE — Progress Notes (Signed)
Called to room to assist during endoscopic procedure.  Patient ID and intended procedure confirmed with present staff. Received instructions for my participation in the procedure from the performing physician.  

## 2021-05-23 NOTE — Patient Instructions (Signed)
HANDOUTS PROVIDED ON: POLYPS, DIVERTICULOSIS, & HEMORRHOIDS  The polyp removed today have been sent for pathology.  The results can take 1-3 weeks to receive.  When your next colonoscopy should occur will be based on the pathology results.    You may resume your previous diet and medication schedule.  Thank you for allowing [place] to care for you today!!!   YOU HAD AN ENDOSCOPIC PROCEDURE TODAY AT [facility]:   Refer to the procedure report that was given to you for any specific questions about what was found during the examination.  If the procedure report does not answer your questions, please call your gastroenterologist to clarify.  If you requested that your care partner not be given the details of your procedure findings, then the procedure report has been included in a sealed envelope for you to review at your convenience later.  YOU SHOULD EXPECT: Some feelings of bloating in the abdomen. Passage of more gas than usual.  Walking can help get rid of the air that was put into your GI tract during the procedure and reduce the bloating. If you had a lower endoscopy (such as a colonoscopy or flexible sigmoidoscopy) you may notice spotting of blood in your stool or on the toilet paper. If you underwent a bowel prep for your procedure, you may not have a normal bowel movement for a few days.  Please Note:  You might notice some irritation and congestion in your nose or some drainage.  This is from the oxygen used during your procedure.  There is no need for concern and it should clear up in a day or so.  SYMPTOMS TO REPORT IMMEDIATELY:  Following lower endoscopy (colonoscopy or flexible sigmoidoscopy):  Excessive amounts of blood in the stool  Significant tenderness or worsening of abdominal pains  Swelling of the abdomen that is new, acute  Fever of 100F or higher  For urgent or emergent issues, a gastroenterologist can be reached at any hour by calling [phone]. Do not  use MyChart messaging for urgent concerns.    DIET:  We do recommend a small meal at first, but then you may proceed to your regular diet.  Drink plenty of fluids but you should avoid alcoholic beverages for 24 hours.  ACTIVITY:  You should plan to take it easy for the rest of today and you should NOT DRIVE or use heavy machinery until tomorrow (because of the sedation medicines used during the test).    FOLLOW UP: Our staff will call the number listed on your records Friday morning between 7:15 am and 8:15 am following your procedure to check on you and address any questions or concerns that you may have regarding the information given to you following your procedure. If we do not reach you, we will leave a message.  We will attempt to reach you two times.  During this call, we will ask if you have developed any symptoms of COVID 19. If you develop any symptoms (ie: fever, flu-like symptoms, shortness of breath, cough etc.) before then, please call [phone].  If you test positive for Covid 19 in the 2 weeks post procedure, please call and report this information to [place].    If any biopsies were taken you will be contacted by phone or by letter within the next 1-3 weeks.  Please call us at [phone] if you have not heard about the biopsies in 3 weeks.    SIGNATURES/CONFIDENTIALITY: You and/or your care partner  have signed paperwork which will be entered into your electronic medical record.  These signatures attest to the fact that that the information above on your After Visit Summary has been reviewed and is understood.  Full responsibility of the confidentiality of this discharge information lies with you and/or your care-partner.

## 2021-05-23 NOTE — Progress Notes (Signed)
Pt's states no medical or surgical changes since previsit or office visit. 

## 2021-05-23 NOTE — Progress Notes (Signed)
HPI: This is a woman at increased risk for CRC  Her brother had a 0.1 cm adenoma without high-grade dysplasia, confirmed on recent path report.  Her father had colon cancer in his 39s.  I think those 2 facts probably increase her risk enough to warrant increased screening.   Colonoscopy November 2017 found diverticulosis throughout the colon.  The examination was otherwise normal.  Colonoscopy September 2012 Dr. Ardis Hughs found diverticulosis in the single subcentimeter hyperplastic polyp.   ROS: complete GI ROS as described in HPI, all other review negative.  Constitutional:  No unintentional weight loss   Past Medical History:  Diagnosis Date   Allergy    sulfer dioxide   Pain in Achilles tendon    bilateral   Sleep apnea    does not use CPAP   TMJ (dislocation of temporomandibular joint)     Past Surgical History:  Procedure Laterality Date   COLONOSCOPY  03/04/2016   Dr.Ladislav Caselli   DILATION AND CURETTAGE OF UTERUS  2010 &2005   KNEE ARTHROSCOPY  1990   left   RETINAL LASER PROCEDURE     WISDOM TOOTH EXTRACTION      Current Outpatient Medications  Medication Sig Dispense Refill   Ascorbic Acid (VITAMIN C GUMMIE PO) Take by mouth.     ondansetron (ZOFRAN) 4 MG tablet Take 1 tablet (4 mg total) by mouth as directed. Take one Zofran pill 30-60 minutes before each colonoscopy prep dose 2 tablet 0   Pediatric Multivit-Minerals-C (GUMMI BEAR MULTIVITAMIN/MIN PO) Take by mouth.     Current Facility-Administered Medications  Medication Dose Route Frequency Provider Last Rate Last Admin   0.9 %  sodium chloride infusion  500 mL Intravenous Once Milus Banister, MD        Allergies as of 05/23/2021 - Review Complete 05/23/2021  Allergen Reaction Noted   Sulfur dioxide Other (See Comments) 12/25/2015    Family History  Problem Relation Age of Onset   Dementia Mother    Colon cancer Father 28   Colon polyps Brother    Esophageal cancer Neg Hx    Stomach cancer Neg Hx     Rectal cancer Neg Hx     Social History   Socioeconomic History   Marital status: Married    Spouse name: Not on file   Number of children: Not on file   Years of education: Not on file   Highest education level: Not on file  Occupational History   Not on file  Tobacco Use   Smoking status: Never   Smokeless tobacco: Never  Vaping Use   Vaping Use: Never used  Substance and Sexual Activity   Alcohol use: Not Currently   Drug use: No   Sexual activity: Not on file  Other Topics Concern   Not on file  Social History Narrative   Not on file   Social Determinants of Health   Financial Resource Strain: Not on file  Food Insecurity: Not on file  Transportation Needs: Not on file  Physical Activity: Not on file  Stress: Not on file  Social Connections: Not on file  Intimate Partner Violence: Not on file     Physical Exam: BP 123/75    Pulse 67    Temp 98.2 F (36.8 C) (Temporal)    Ht 5\' 9"  (1.753 m)    Wt 188 lb (85.3 kg)    SpO2 100%    BMI 27.76 kg/m  Constitutional: generally well-appearing Psychiatric: alert and oriented x3 Lungs:  CTA bilaterally Heart: no MCR  Assessment and plan: 56 y.o. female with increased risk for CRC  Colonoscopy todcay  Care is appropriate for the ambulatory setting.  Owens Loffler, MD [facility] 05/23/2021, 7:53 AM

## 2021-05-24 ENCOUNTER — Ambulatory Visit: Payer: 59 | Admitting: Podiatry

## 2021-05-24 ENCOUNTER — Ambulatory Visit (INDEPENDENT_AMBULATORY_CARE_PROVIDER_SITE_OTHER): Payer: 59

## 2021-05-24 ENCOUNTER — Encounter: Payer: Self-pay | Admitting: Podiatry

## 2021-05-24 DIAGNOSIS — M7672 Peroneal tendinitis, left leg: Secondary | ICD-10-CM

## 2021-05-24 NOTE — Patient Instructions (Signed)
Achilles Tendinitis  with Rehab Achilles tendinitis is a disorder of the Achilles tendon. The Achilles tendon connects the large calf muscles (Gastrocnemius and Soleus) to the heel bone (calcaneus). This tendon is sometimes called the heel cord. It is important for pushing-off and standing on your toes and is important for walking, running, or jumping. Tendinitis is often caused by overuse and repetitive microtrauma. SYMPTOMS Pain, tenderness, swelling, warmth, and redness may occur over the Achilles tendon even at rest. Pain with pushing off, or flexing or extending the ankle. Pain that is worsened after or during activity. CAUSES  Overuse sometimes seen with rapid increase in exercise programs or in sports requiring running and jumping. Poor physical conditioning (strength and flexibility or endurance). Running sports, especially training running down hills. Inadequate warm-up before practice or play or failure to stretch before participation. Injury to the tendon. PREVENTION  Warm up and stretch before practice or competition. Allow time for adequate rest and recovery between practices and competition. Keep up conditioning. Keep up ankle and leg flexibility. Improve or keep muscle strength and endurance. Improve cardiovascular fitness. Use proper technique. Use proper equipment (shoes, skates). To help prevent recurrence, taping, protective strapping, or an adhesive bandage may be recommended for several weeks after healing is complete. PROGNOSIS  Recovery may take weeks to several months to heal. Longer recovery is expected if symptoms have been prolonged. Recovery is usually quicker if the inflammation is due to a direct blow as compared with overuse or sudden strain. RELATED COMPLICATIONS  Healing time will be prolonged if the condition is not correctly treated. The injury must be given plenty of time to heal. Symptoms can reoccur if activity is resumed too soon. Untreated,  tendinitis may increase the risk of tendon rupture requiring additional time for recovery and possibly surgery. TREATMENT  The first treatment consists of rest anti-inflammatory medication, and ice to relieve the pain. Stretching and strengthening exercises after resolution of pain will likely help reduce the risk of recurrence. Referral to a physical therapist or athletic trainer for further evaluation and treatment may be helpful. A walking boot or cast may be recommended to rest the Achilles tendon. This can help break the cycle of inflammation and microtrauma. Arch supports (orthotics) may be prescribed or recommended by your caregiver as an adjunct to therapy and rest. Surgery to remove the inflamed tendon lining or degenerated tendon tissue is rarely necessary and has shown less than predictable results. MEDICATION  Nonsteroidal anti-inflammatory medications, such as aspirin and ibuprofen, may be used for pain and inflammation relief. Do not take within 7 days before surgery. Take these as directed by your caregiver. Contact your caregiver immediately if any bleeding, stomach upset, or signs of allergic reaction occur. Other minor pain relievers, such as acetaminophen, may also be used. Pain relievers may be prescribed as necessary by your caregiver. Do not take prescription pain medication for longer than 4 to 7 days. Use only as directed and only as much as you need. Cortisone injections are rarely indicated. Cortisone injections may weaken tendons and predispose to rupture. It is better to give the condition more time to heal than to use them. HEAT AND COLD Cold is used to relieve pain and reduce inflammation for acute and chronic Achilles tendinitis. Cold should be applied for 10 to 15 minutes every 2 to 3 hours for inflammation and pain and immediately after any activity that aggravates your symptoms. Use ice packs or an ice massage. Heat may be used before performing stretching  and  strengthening activities prescribed by your caregiver. Use a heat pack or a warm soak. SEEK MEDICAL CARE IF: Symptoms get worse or do not improve in 2 weeks despite treatment. New, unexplained symptoms develop. Drugs used in treatment may produce side effects.  EXERCISES:  RANGE OF MOTION (ROM) AND STRETCHING EXERCISES - Achilles Tendinitis  These exercises may help you when beginning to rehabilitate your injury. Your symptoms may resolve with or without further involvement from your physician, physical therapist or athletic trainer. While completing these exercises, remember:  Restoring tissue flexibility helps normal motion to return to the joints. This allows healthier, less painful movement and activity. An effective stretch should be held for at least 30 seconds. A stretch should never be painful. You should only feel a gentle lengthening or release in the stretched tissue.  STRETCH  Gastroc, Standing  Place hands on wall. Extend right / left leg, keeping the front knee somewhat bent. Slightly point your toes inward on your back foot. Keeping your right / left heel on the floor and your knee straight, shift your weight toward the wall, not allowing your back to arch. You should feel a gentle stretch in the right / left calf. Hold this position for 10 seconds. Repeat 3 times. Complete this stretch 2 times per day.  STRETCH  Soleus, Standing  Place hands on wall. Extend right / left leg, keeping the other knee somewhat bent. Slightly point your toes inward on your back foot. Keep your right / left heel on the floor, bend your back knee, and slightly shift your weight over the back leg so that you feel a gentle stretch deep in your back calf. Hold this position for 10 seconds. Repeat 3 times. Complete this stretch 2 times per day.  STRETCH  Gastrocsoleus, Standing  Note: This exercise can place a lot of stress on your foot and ankle. Please complete this exercise only if specifically  instructed by your caregiver.  Place the ball of your right / left foot on a step, keeping your other foot firmly on the same step. Hold on to the wall or a rail for balance. Slowly lift your other foot, allowing your body weight to press your heel down over the edge of the step. You should feel a stretch in your right / left calf. Hold this position for 10 seconds. Repeat this exercise with a slight bend in your knee. Repeat 3 times. Complete this stretch 2 times per day.   STRENGTHENING EXERCISES - Achilles Tendinitis These exercises may help you when beginning to rehabilitate your injury. They may resolve your symptoms with or without further involvement from your physician, physical therapist or athletic trainer. While completing these exercises, remember:  Muscles can gain both the endurance and the strength needed for everyday activities through controlled exercises. Complete these exercises as instructed by your physician, physical therapist or athletic trainer. Progress the resistance and repetitions only as guided. You may experience muscle soreness or fatigue, but the pain or discomfort you are trying to eliminate should never worsen during these exercises. If this pain does worsen, stop and make certain you are following the directions exactly. If the pain is still present after adjustments, discontinue the exercise until you can discuss the trouble with your clinician.  STRENGTH - Plantar-flexors  Sit with your right / left leg extended. Holding onto both ends of a rubber exercise band/tubing, loop it around the ball of your foot. Keep a slight tension in the band. Slowly  push your toes away from you, pointing them downward. Hold this position for 10 seconds. Return slowly, controlling the tension in the band/tubing. Repeat 3 times. Complete this exercise 2 times per day.   STRENGTH - Plantar-flexors  Stand with your feet shoulder width apart. Steady yourself with a wall or table  using as little support as needed. Keeping your weight evenly spread over the width of your feet, rise up on your toes.* Hold this position for 10 seconds. Repeat 3 times. Complete this exercise 2 times per day.  *If this is too easy, shift your weight toward your right / left leg until you feel challenged. Ultimately, you may be asked to do this exercise with your right / left foot only.  STRENGTH  Plantar-flexors, Eccentric  Note: This exercise can place a lot of stress on your foot and ankle. Please complete this exercise only if specifically instructed by your caregiver.  Place the balls of your feet on a step. With your hands, use only enough support from a wall or rail to keep your balance. Keep your knees straight and rise up on your toes. Slowly shift your weight entirely to your right / left toes and pick up your opposite foot. Gently and with controlled movement, lower your weight through your right / left foot so that your heel drops below the level of the step. You will feel a slight stretch in the back of your calf at the end position. Use the healthy leg to help rise up onto the balls of both feet, then lower weight only on the right / left leg again. Build up to 15 repetitions. Then progress to 3 consecutive sets of 15 repetitions.* After completing the above exercise, complete the same exercise with a slight knee bend (about 30 degrees). Again, build up to 15 repetitions. Then progress to 3 consecutive sets of 15 repetitions.* Perform this exercise 2 times per day.  *When you easily complete 3 sets of 15, your physician, physical therapist or athletic trainer may advise you to add resistance by wearing a backpack filled with additional weight.  STRENGTH - Plantar Flexors, Seated  Sit on a chair that allows your feet to rest flat on the ground. If necessary, sit at the edge of the chair. Keeping your toes firmly on the ground, lift your right / left heel as far as you can without  increasing any discomfort in your ankle. Repeat 3 times. Complete this exercise 2 times a day.   Peroneal Tendinopathy Rehab Ask your health care provider which exercises are safe for you. Do exercises exactly as told by your health care provider and adjust them as directed. It is normal to feel mild stretching, pulling, tightness, or discomfort as you do these exercises. Stop right away if you feel sudden pain or your pain gets worse. Do not begin these exercises until told by your health care provider. Stretching and range-of-motion exercises These exercises warm up your muscles and joints and improve the movement and flexibility of your ankle. These exercises also help to relieve pain and stiffness. Gastroc and soleus stretch, standing This is an exercise in which you stand on a step and use your body weight to stretch your calf muscles. To do this exercise: Stand on the edge of a step on the ball of your left / right foot. The ball of your foot is on the walking surface, right under your toes. Keep your other foot firmly on the same step. Hold on to  the wall, a railing, or a chair for balance. Slowly lift your other foot, allowing your body weight to press your left / right heel down over the edge of the step. You should feel a stretch in your left / right calf (gastrocnemius and soleus). Hold this position for __________ seconds. Return both feet to the step. Repeat this exercise with a slight bend in your left / right knee. Repeat __________ times with your left / right knee straight and __________ times with your left / right knee bent. Complete this exercise __________ times a day. Strengthening exercises These exercises build strength and endurance in your foot and ankle. Endurance is the ability to use your muscles for a long time, even after they get tired. Ankle dorsiflexion with band  Secure a rubber exercise band or tube to an object, such as a table leg, that will not move when  the band is pulled. Secure the other end of the band around your left / right foot. Sit on the floor, facing the object with your left / right leg extended. The band or tube should be slightly tense when your foot is relaxed. Slowly flex your left / right ankle and toes to bring your foot toward you (dorsiflexion). Hold this position for __________ seconds. Let the band or tube slowly pull your foot back to the starting position. Repeat __________ times. Complete this exercise __________ times a day. Ankle eversion Sit on the floor with your legs straight out in front of you. Loop a rubber exercise band or tube around the ball of your left / right foot. The ball of your foot is on the walking surface, right under your toes. Hold the ends of the band in your hands, or secure the band to a stable object. The band or tube should be slightly tense when your foot is relaxed. Slowly push your foot outward, away from your other leg (eversion). Hold this position for __________ seconds. Slowly return your foot to the starting position. Repeat __________ times. Complete this exercise __________ times a day. Plantar flexion, standing This exercise is sometimes called standing heel raise. Stand with your feet shoulder-width apart. Place your hands on a wall or table to steady yourself as needed, but try not to use it for support. Keep your weight spread evenly over the width of your feet while you slowly rise up on your toes (plantar flexion). If told by your health care provider: Shift your weight toward your left / right leg until you feel challenged. Stand on your left / right leg only. Hold this position for __________ seconds. Repeat __________ times. Complete this exercise __________ times a day. Single leg stand Without shoes, stand near a railing or in a doorway. You may hold on to the railing or door frame as needed. Stand on your left / right foot. Keep your big toe down on the floor and try  to keep your arch lifted. Do not roll to the outside of your foot. If this exercise is too easy, you can try it with your eyes closed or while standing on a pillow. Hold this position for __________ seconds. Repeat __________ times. Complete this exercise __________ times a day. This information is not intended to replace advice given to you by your health care provider. Make sure you discuss any questions you have with your health care provider. Document Revised: 07/14/2018 Document Reviewed: 07/14/2018 Elsevier Patient Education  [place].

## 2021-05-24 NOTE — Progress Notes (Signed)
°  Subjective:  Patient ID: Katelyn Wagner, female    DOB: 08/12/1965,   MRN: [id number]  Chief Complaint  Patient presents with   Foot Pain    Left foot pain , constant only when walking     56 y.o. female presents for left foot pain that started 05/21/20. Relates a sharp pain on the left side of her foot when walking. No swelling or redness. Relates rest helps. Relates she may have broke her left great toe in October after hitting a suitcase. Relates some continued sharp pain.  . Denies any other pedal complaints. Denies n/v/f/c.   Past Medical History:  Diagnosis Date   Allergy    sulfer dioxide   Pain in Achilles tendon    bilateral   Sleep apnea    does not use CPAP   TMJ (dislocation of temporomandibular joint)     Objective:  Physical Exam: Vascular: DP/PT pulses 2/4 bilateral. CFT <3 seconds. Normal hair growth on digits. No edema.  Skin. No lacerations or abrasions bilateral feet.  Musculoskeletal: MMT 5/5 bilateral lower extremities in DF, PF, Inversion and Eversion. Deceased ROM in DF of ankle joint. Tender around insertion to peroneal tendon. No pain proximally along the tendon. Possible soreness to the base of the fifth metatarsal.  Neurological: Sensation intact to light touch.   Assessment:   1. Peroneal tendinitis of left lower extremity      Plan:  Patient was evaluated and treated and all questions answered. X-rays reviewed and discussed with patient. No acute fractures or dislocations noted.  Discussed peroneal tendinitis and treatment options at length with patient Discussed stretching exercises and provided handout. Patient wold like to avoid medications.  Discussed use of brace of boot she has at home.  Discussed that if the symptoms do not improve can consider PT/MRI. Patient to return in 6 to 8 weeks or sooner if symptoms fail to improve or worsen.   Lorenda Peck, DPM

## 2021-05-25 ENCOUNTER — Telehealth: Payer: Self-pay | Admitting: *Deleted

## 2021-05-25 ENCOUNTER — Telehealth: Payer: Self-pay

## 2021-05-25 NOTE — Telephone Encounter (Signed)
°  Follow up Call-  Call back number 05/23/2021  Post procedure Call Back phone  # [phone]  Permission to leave phone message Yes  Some recent data might be hidden     Patient questions:  Do you have a fever, pain , or abdominal swelling? No. Pain Score  0 *  Have you tolerated food without any problems? Yes.    Have you been able to return to your normal activities? Yes.    Do you have any questions about your discharge instructions: Diet   No. Medications  No. Follow up visit  No.  Do you have questions or concerns about your Care? No.  Actions: * If pain score is 4 or above: No action needed, pain <4.

## 2021-05-25 NOTE — Telephone Encounter (Signed)
First attempt, left VM.  

## 2021-05-29 ENCOUNTER — Encounter: Payer: Self-pay | Admitting: Gastroenterology

## 2021-06-11 ENCOUNTER — Ambulatory Visit: Payer: 59 | Admitting: Neurology

## 2021-06-14 ENCOUNTER — Encounter: Payer: Self-pay | Admitting: Gastroenterology

## 2021-08-10 ENCOUNTER — Other Ambulatory Visit: Payer: Self-pay | Admitting: Sports Medicine

## 2021-08-10 ENCOUNTER — Ambulatory Visit
Admission: RE | Admit: 2021-08-10 | Discharge: 2021-08-10 | Disposition: A | Discharge status: Home / Self Care | Payer: 59 | Source: Ambulatory Visit | Attending: Sports Medicine | Admitting: Sports Medicine

## 2021-08-10 DIAGNOSIS — M545 Low back pain, unspecified: Secondary | ICD-10-CM

## 2021-08-18 ENCOUNTER — Encounter: Payer: Self-pay | Admitting: Neurology

## 2021-08-21 ENCOUNTER — Encounter: Payer: Self-pay | Admitting: Neurology

## 2021-08-21 ENCOUNTER — Ambulatory Visit: Payer: 59 | Admitting: Neurology

## 2021-08-21 VITALS — BP 120/68 | HR 68 | Ht 69.0 in | Wt 190.5 lb

## 2021-08-21 DIAGNOSIS — G4733 Obstructive sleep apnea (adult) (pediatric): Secondary | ICD-10-CM

## 2021-08-21 DIAGNOSIS — G478 Other sleep disorders: Secondary | ICD-10-CM

## 2021-08-21 MED ORDER — TRAZODONE HCL 50 MG PO TABS: 50.0000 mg | ORAL_TABLET | Freq: Every day | ORAL | 1 refills | Status: DC

## 2021-08-21 NOTE — Addendum Note (Signed)
Addended by: Larey Seat on: 08/21/2021 10:21 AM ? ? Modules accepted: Orders ? ?

## 2021-08-21 NOTE — Patient Instructions (Signed)
Trazodone Tablets What is this medication? TRAZODONE (TRAZ oh done) treats depression. It increases the amount of serotonin in the brain, a hormone that helps regulate mood. This medicine may be used for other purposes; ask your health care provider or pharmacist if you have questions. COMMON BRAND NAME(S): Desyrel What should I tell my care team before I take this medication? They need to know if you have any of these conditions: Attempted suicide or thinking about it Bipolar disorder Bleeding problems Glaucoma Heart disease, or previous heart attack Irregular heart beat Kidney or liver disease Low levels of sodium in the blood An unusual or allergic reaction to trazodone, other medications, foods, dyes or preservatives Pregnant or trying to get pregnant Breast-feeding How should I use this medication? Take this medication by mouth with a glass of water. Follow the directions on the prescription label. Take this medication shortly after a meal or a light snack. Take your medication at regular intervals. Do not take your medication more often than directed. Do not stop taking this medication suddenly except upon the advice of your care team. Stopping this medication too quickly may cause serious side effects or your condition may worsen. A special MedGuide will be given to you by the pharmacist with each prescription and refill. Be sure to read this information carefully each time. Talk to your care team regarding the use of this medication in children. Special care may be needed. Overdosage: If you think you have taken too much of this medicine contact a poison control center or emergency room at once. NOTE: This medicine is only for you. Do not share this medicine with others. What if I miss a dose? If you miss a dose, take it as soon as you can. If it is almost time for your next dose, take only that dose. Do not take double or extra doses. What may interact with this medication? Do not  take this medication with any of the following: Certain medications for fungal infections like fluconazole, itraconazole, ketoconazole, posaconazole, voriconazole Cisapride Dronedarone Linezolid MAOIs like Carbex, Eldepryl, Marplan, Nardil, and Parnate Mesoridazine Methylene blue (injected into a vein) Pimozide Saquinavir Thioridazine This medication may also interact with the following: Alcohol Antiviral medications for HIV or AIDS Aspirin and aspirin-like medications Barbiturates like phenobarbital Certain medications for blood pressure, heart disease, irregular heart beat Certain medications for depression, anxiety, or psychotic disturbances Certain medications for migraine headache like almotriptan, eletriptan, frovatriptan, naratriptan, rizatriptan, sumatriptan, zolmitriptan Certain medications for seizures like carbamazepine and phenytoin Certain medications for sleep Certain medications that treat or prevent blood clots like dalteparin, enoxaparin, warfarin Digoxin Fentanyl Lithium NSAIDS, medications for pain and inflammation, like ibuprofen or naproxen Other medications that prolong the QT interval (cause an abnormal heart rhythm) like dofetilide Rasagiline Supplements like St. John's wort, kava kava, valerian Tramadol Tryptophan This list may not describe all possible interactions. Give your health care provider a list of all the medicines, herbs, non-prescription drugs, or dietary supplements you use. Also tell them if you smoke, drink alcohol, or use illegal drugs. Some items may interact with your medicine. What should I watch for while using this medication? Tell your care team if your symptoms do not get better or if they get worse. Visit your care team for regular checks on your progress. Because it may take several weeks to see the full effects of this medication, it is important to continue your treatment as prescribed by your care team. Watch for new or worsening  thoughts of   suicide or depression. This includes sudden changes in mood, behaviors, or thoughts. These changes can happen at any time but are more common in the beginning of treatment or after a change in dose. Call your care team right away if you experience these thoughts or worsening depression. Manic episodes may happen in patients with bipolar disorder who take this medication. Watch for changes in feelings or behaviors such as feeling anxious, nervous, agitated, panicky, irritable, hostile, aggressive, impulsive, severely restless, overly excited and hyperactive, or trouble sleeping. These changes can happen at any time but are more common in the beginning of treatment or after a change in dose. Call your care team right away if you notice any of these symptoms. You may get drowsy or dizzy. Do not drive, use machinery, or do anything that needs mental alertness until you know how this medication affects you. Do not stand or sit up quickly, especially if you are an older patient. This reduces the risk of dizzy or fainting spells. Alcohol may interfere with the effect of this medication. Avoid alcoholic drinks. This medication may cause dry eyes and blurred vision. If you wear contact lenses you may feel some discomfort. Lubricating drops may help. See your eye doctor if the problem does not go away or is severe. Your mouth may get dry. Chewing sugarless gum, sucking hard candy and drinking plenty of water may help. Contact your care team if the problem does not go away or is severe. What side effects may I notice from receiving this medication? Side effects that you should report to your care team as soon as possible: Allergic reactions--skin rash, itching, hives, swelling of the face, lips, tongue, or throat Bleeding--bloody or black, tar-like stools, red or dark brown urine, vomiting blood or brown material that looks like coffee grounds, small, red or purple spots on skin, unusual bleeding or  bruising Heart rhythm changes--fast or irregular heartbeat, dizziness, feeling faint or lightheaded, chest pain, trouble breathing Low blood pressure--dizziness, feeling faint or lightheaded, blurry vision Low sodium level--muscle weakness, fatigue, dizziness, headache, confusion Prolonged or painful erection Serotonin syndrome--irritability, confusion, fast or irregular heartbeat, muscle stiffness, twitching muscles, sweating, high fever, seizures, chills, vomiting, diarrhea Sudden eye pain or change in vision such as blurry vision, seeing halos around lights, vision loss Thoughts of suicide or self-harm, worsening mood, feelings of depression Side effects that usually do not require medical attention (report to your care team if they continue or are bothersome): Change in sex drive or performance Constipation Dizziness Drowsiness Dry mouth This list may not describe all possible side effects. Call your doctor for medical advice about side effects. You may report side effects to FDA at 1-800-FDA-1088. Where should I keep my medication? Keep out of the reach of children and pets. Store at room temperature between 15 and 30 degrees C (59 to 86 degrees F). Protect from light. Keep container tightly closed. Throw away any unused medication after the expiration date. NOTE: This sheet is a summary. It may not cover all possible information. If you have questions about this medicine, talk to your doctor, pharmacist, or health care provider.  2023 Elsevier/Gold Standard (2020-03-15 00:00:00)  
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
## 2021-08-21 NOTE — Progress Notes (Signed)
New order faxed to DME: [facility] Fax:[phone]   Fax confirmation received 

## 2021-08-28 ENCOUNTER — Other Ambulatory Visit: Payer: Self-pay | Admitting: Sports Medicine

## 2021-08-28 DIAGNOSIS — M545 Low back pain, unspecified: Secondary | ICD-10-CM

## 2021-09-08 ENCOUNTER — Ambulatory Visit
Admission: RE | Admit: 2021-09-08 | Discharge: 2021-09-08 | Disposition: A | Discharge status: Home / Self Care | Payer: 59 | Source: Ambulatory Visit | Attending: Sports Medicine | Admitting: Sports Medicine

## 2021-09-08 DIAGNOSIS — M545 Low back pain, unspecified: Secondary | ICD-10-CM

## 2021-09-10 ENCOUNTER — Other Ambulatory Visit: Payer: Self-pay | Admitting: Sports Medicine

## 2021-09-12 ENCOUNTER — Other Ambulatory Visit: Payer: Self-pay | Admitting: Physical Medicine and Rehabilitation

## 2021-09-12 DIAGNOSIS — M47816 Spondylosis without myelopathy or radiculopathy, lumbar region: Secondary | ICD-10-CM

## 2021-10-18 ENCOUNTER — Encounter: Payer: Self-pay | Admitting: Physical Medicine and Rehabilitation

## 2021-10-18 ENCOUNTER — Telehealth: Payer: Self-pay | Admitting: Physical Medicine and Rehabilitation

## 2021-10-18 ENCOUNTER — Ambulatory Visit: Payer: Self-pay

## 2021-10-18 ENCOUNTER — Ambulatory Visit: Payer: 59 | Admitting: Physical Medicine and Rehabilitation

## 2021-10-18 VITALS — BP 132/84 | HR 71

## 2021-10-18 DIAGNOSIS — M47816 Spondylosis without myelopathy or radiculopathy, lumbar region: Secondary | ICD-10-CM

## 2021-10-18 MED ORDER — METHYLPREDNISOLONE ACETATE 80 MG/ML IJ SUSP
80.0000 mg | Freq: Once | INTRAMUSCULAR | Status: AC
  Administered 2021-10-18: 80 mg

## 2021-10-18 NOTE — Procedures (Signed)
Lumbar Facet Joint Intra-Articular Injection(s) with Fluoroscopic Guidance  Patient: Katelyn Wagner      Date of Birth: [birth year] MRN: [id number] PCP: Crist Infante, MD      Visit Date: 10/18/2021   Universal Protocol:    Date/Time: 10/18/2021  Consent Given By: the patient  Position: PRONE   Additional Comments: Vital signs were monitored before and after the procedure. Patient was prepped and draped in the usual sterile fashion. The correct patient, procedure, and site was verified.   Injection Procedure Details:  Procedure Site One Meds Administered:  Meds ordered this encounter  Medications   methylPREDNISolone acetate (DEPO-MEDROL) injection 80 mg     Laterality: Right  Location/Site:  L4-L5  Needle size: 22 guage  Needle type: Spinal  Needle Placement: Articular  Findings:  -Comments: Excellent flow of contrast producing a partial arthrogram.  Procedure Details: The fluoroscope beam is vertically oriented in AP, and the inferior recess is visualized beneath the lower pole of the inferior apophyseal process, which represents the target point for needle insertion. When direct visualization is difficult the target point is located at the medial projection of the vertebral pedicle. The region overlying each aforementioned target is locally anesthetized with a 1 to 2 ml. volume of 1% Lidocaine without Epinephrine.   The spinal needle was inserted into each of the above mentioned facet joints using biplanar fluoroscopic guidance. A 0.25 to 0.5 ml. volume of Isovue-250 was injected and a partial facet joint arthrogram was obtained. A single spot film was obtained of the resulting arthrogram.    One to 1.25 ml of the steroid/anesthetic solution was then injected into each of the facet joints noted above.   Additional Comments:  The injection itself went very well with good partial arthrogram with some reproduction of mild pain as normal.  However upon arising from  the position of laying down she had pretty significant pain with weightbearing in the procedure room as relayed to me by my nurse practitioner Barnet Pall as well as the radiology tech.  My nurse practitioner did examine her briefly and she was neurovascularly intact and had her walk around some.  I saw her after that and explain what we were seeing and what she was experiencing.  Tried to reassure her but she was fairly anxious.  We did let her sit for a while in the recovery area and upon discharge she said she was about 60% better but still very uncomfortable.  I did offer to let her sit for a while longer and did again reassure her.  She really just wanted to leave at that point and so I did walk her out to the waiting room.  Dressing: 2 x 2 sterile gauze and Band-Aid    Post-procedure details: Patient was observed during the procedure. Post-procedure instructions were reviewed.  Patient left the clinic in stable condition.

## 2021-10-18 NOTE — Patient Instructions (Signed)

## 2021-10-18 NOTE — Progress Notes (Signed)
Katelyn Wagner - 56 y.o. female MRN [id number]  Date of birth: February 01, 1966  Office Visit Note: Visit Date: 10/18/2021 PCP: Crist Infante, MD Referred by: Gerda Diss, DO  Subjective: Chief Complaint  Patient presents with   Lower Back - Pain   Right Leg - Pain   HPI:  Katelyn Wagner is a 56 y.o. female who comes in today at the request of Dr. Teresa Coombs for planned Right L4-5 Lumbar facet/medial branch block with fluoroscopic guidance.  The patient has failed conservative care including home exercise, chiropractic care and [place], medications, time and activity modification.  This injection will be diagnostic and hopefully therapeutic.  Please see requesting physician notes for further details and justification.  Exam has shown concordant pain with facet joint loading. MRI reviewed with images and spine model.  MRI reviewed in the note below.  Back pain stems from a fall and is located in the right lumbosacral area with occasional referral to the anterior lateral leg but the back pain is really more prominent.  Dr. Paulla Fore felt like the fluid signal changes on the right L4-5 facet joint and the MRI could be a sign that there was pain in this area.  We did go over this at length with the patient and I tried to reassure her that while that may be at least some signal as to maybe where to do the injection and does not signify an injury in itself.  We do see fluid signal changes in joints routinely.  She also does have a small central paracentral protrusion with annular tear at L5-S1.  She does get some pain with sitting more than standing which would be somewhat clinically more related potentially to the disc annular tear and protrusion.  There is no frank nerve compression I did try to reassure her that her fall release did not cause any significant structural changes to the spine.  She does report a history of swimming and she likes to swim for exercise and really has not been able  to do that in the last time this seem to really flared things up after a period where she was better for a while.  She has had intermittent times where it was somewhat better but she is really limited in her daily activities at this point.  Today we did complete intra-articular injection of the right L4-5 facet joint.  Patient had no difficulty with the injection itself and you can see with the report but did have post procedure pain increase upon standing and ambulating.  She had a lot of severe pain with weightbearing which we do see from time to time with intra-articular facet joint injections.  By the time she left that had improved about 60% but was still giving her some pain.  We will call her this afternoon or in the morning to see if she is feeling okay with that but I did reassure her that this typically just comes down on its own is more of a pressure situation.  Depending on relief consider epidural injection.  She will follow-up with Dr. Paulla Fore as scheduled on Monday.   ROS Otherwise per HPI.  Assessment & Plan: Visit Diagnoses:    ICD-10-CM   1. Spondylosis without myelopathy or radiculopathy, lumbar region  M47.816 XR C-ARM NO REPORT    Facet Injection    methylPREDNISolone acetate (DEPO-MEDROL) injection 80 mg      Plan: No additional findings.   Meds & Orders:  Meds ordered  this encounter  Medications   methylPREDNISolone acetate (DEPO-MEDROL) injection 80 mg    Orders Placed This Encounter  Procedures   Facet Injection   XR C-ARM NO REPORT    Follow-up: Return if symptoms worsen or fail to improve.   Procedures: No procedures performed  Lumbar Facet Joint Intra-Articular Injection(s) with Fluoroscopic Guidance  Patient: Katelyn Wagner      Date of Birth: 11-24-1965 MRN: [id number] PCP: Crist Infante, MD      Visit Date: 10/18/2021   Universal Protocol:    Date/Time: 10/18/2021  Consent Given By: the patient  Position: PRONE   Additional  Comments: Vital signs were monitored before and after the procedure. Patient was prepped and draped in the usual sterile fashion. The correct patient, procedure, and site was verified.   Injection Procedure Details:  Procedure Site One Meds Administered:  Meds ordered this encounter  Medications   methylPREDNISolone acetate (DEPO-MEDROL) injection 80 mg     Laterality: Right  Location/Site:  L4-L5  Needle size: 22 guage  Needle type: Spinal  Needle Placement: Articular  Findings:  -Comments: Excellent flow of contrast producing a partial arthrogram.  Procedure Details: The fluoroscope beam is vertically oriented in AP, and the inferior recess is visualized beneath the lower pole of the inferior apophyseal process, which represents the target point for needle insertion. When direct visualization is difficult the target point is located at the medial projection of the vertebral pedicle. The region overlying each aforementioned target is locally anesthetized with a 1 to 2 ml. volume of 1% Lidocaine without Epinephrine.   The spinal needle was inserted into each of the above mentioned facet joints using biplanar fluoroscopic guidance. A 0.25 to 0.5 ml. volume of Isovue-250 was injected and a partial facet joint arthrogram was obtained. A single spot film was obtained of the resulting arthrogram.    One to 1.25 ml of the steroid/anesthetic solution was then injected into each of the facet joints noted above.   Additional Comments:  The injection itself went very well with good partial arthrogram with some reproduction of mild pain as normal.  However upon arising from the position of laying down she had pretty significant pain with weightbearing in the procedure room as relayed to me by my nurse practitioner Barnet Pall as well as the radiology tech.  My nurse practitioner did examine her briefly and she was neurovascularly intact and had her walk around some.  I saw her after that  and explain what we were seeing and what she was experiencing.  Tried to reassure her but she was fairly anxious.  We did let her sit for a while in the recovery area and upon discharge she said she was about 60% better but still very uncomfortable.  I did offer to let her sit for a while longer and did again reassure her.  She really just wanted to leave at that point and so I did walk her out to the waiting room.  Dressing: 2 x 2 sterile gauze and Band-Aid    Post-procedure details: Patient was observed during the procedure. Post-procedure instructions were reviewed.  Patient left the clinic in stable condition.    Clinical History: MRI LUMBAR SPINE WITHOUT CONTRAST   TECHNIQUE: Multiplanar, multisequence MR imaging of the lumbar spine was performed. No intravenous contrast was administered.   COMPARISON:  MRI lumbar spine Sep 05, 2018.   FINDINGS: Segmentation: Standard segmentation is assumed. The inferior-most fully formed intervertebral disc is labeled L5-S1. Same numbering  as on the prior MRI.   Alignment:  No substantial sagittal subluxation.   Vertebrae: No in evidence of acute fracture or discitis/osteomyelitis. No suspicious bone lesion.   Conus medullaris and cauda equina: Conus extends to the L1-L2 level. Conus and cauda equina appear normal.   Paraspinal and other soft tissues: Unremarkable.   Disc levels:   T12-L1: No significant disc protrusion, foraminal stenosis, or canal stenosis.   L1-L2: No significant disc protrusion, foraminal stenosis, or canal stenosis.   L2-L3: Minimal disc bulge without significant stenosis.  No change.   L3-L4: Mild, right eccentric disc bulging without significant stenosis. No change.   L4-L5: Mild disc bulging with small central disc protrusion. No canal stenosis. Mild left subarticular recess stenosis. No change.   L5-S1: Mild broad disc bulge with annular fissure. Mild bilateral subarticular recess stenosis without  significant canal stenosis. No significant foraminal stenosis.   IMPRESSION: Similar mild multilevel degenerative change without significant canal or foraminal stenosis. Similar mild subarticular recess stenosis the left at L4-L5 and bilaterally at L5-S1.     Electronically Signed   By: Margaretha Sheffield M.D.   On: 09/10/2021 11:46     Objective:  VS:  HT:    WT:   BMI:     BP:132/84  HR:71bpm  TEMP: ( )  RESP:  Physical Exam Vitals and nursing note reviewed.  Constitutional:      General: She is not in acute distress.    Appearance: Normal appearance. She is not ill-appearing.  HENT:     Head: Normocephalic and atraumatic.     Right Ear: External ear normal.     Left Ear: External ear normal.  Eyes:     Extraocular Movements: Extraocular movements intact.  Cardiovascular:     Rate and Rhythm: Normal rate.     Pulses: Normal pulses.  Pulmonary:     Effort: Pulmonary effort is normal. No respiratory distress.  Abdominal:     General: There is no distension.     Palpations: Abdomen is soft.  Musculoskeletal:        General: Tenderness present.     Cervical back: Neck supple.     Right lower leg: No edema.     Left lower leg: No edema.     Comments: Patient has good distal strength with no pain over the greater trochanters.  No clonus or focal weakness.  Skin:    Findings: No erythema, lesion or rash.  Neurological:     General: No focal deficit present.     Mental Status: She is alert and oriented to person, place, and time.     Sensory: No sensory deficit.     Motor: No weakness or abnormal muscle tone.     Coordination: Coordination normal.  Psychiatric:        Mood and Affect: Mood normal.        Behavior: Behavior normal.      Imaging: XR C-ARM NO REPORT  Result Date: 10/18/2021 Please see Notes tab for imaging impression.

## 2021-10-18 NOTE — Telephone Encounter (Signed)
Spoke with patient via telephone this afternoon, states she feels much better, denies any issues with numbness/tingling or pain to right leg. Patient to follow up with Dr. Paulla Fore on Monday 10/22/2021.

## 2021-10-18 NOTE — Progress Notes (Signed)
Pt state lower back pain that travels down her right leg. Pt state sitting and bending makes the pain worse. Pt state she takes pain meds to help ease her pain.  Numeric Pain Rating Scale and Functional Assessment Average Pain 2    In the last MONTH (on 0-10 scale) has pain interfered with the following?  1. General activity like being  able to carry out your everyday physical activities such as walking, climbing stairs, carrying groceries, or moving a chair?  Rating(8)   +Driver, -BT, -Dye Allergies.

## 2021-10-24 ENCOUNTER — Telehealth: Payer: Self-pay | Admitting: Physical Medicine and Rehabilitation

## 2021-10-24 NOTE — Telephone Encounter (Signed)
Spoke with patient via telephone this morning, states all of her pain returned after manipulation treatment with Dr. Paulla Fore. Patient had right L4-L5 intra-articular facet injection on 10/18/2021.  Patient instructed to wait another week and see if her pain subsides.

## 2021-10-24 NOTE — Telephone Encounter (Signed)
Added note will injection become effective again after incident. Please call pt at [phone].

## 2021-10-24 NOTE — Telephone Encounter (Signed)
Pt called requesting a call back. Pt states she had an injection and it helped but the pain has return due to an incident. Please call pt at [phone].

## 2021-12-11 ENCOUNTER — Telehealth: Payer: Self-pay | Admitting: Physical Medicine and Rehabilitation

## 2021-12-11 NOTE — Telephone Encounter (Signed)
Patient called. She would like an appointment with Dr. Newton.  

## 2022-01-03 ENCOUNTER — Encounter: Payer: 59 | Admitting: Physical Medicine and Rehabilitation

## 2022-02-21 ENCOUNTER — Ambulatory Visit: Payer: 59 | Admitting: Neurology

## 2022-02-21 ENCOUNTER — Encounter: Payer: Self-pay | Admitting: Neurology

## 2022-02-21 VITALS — BP 107/62 | HR 61 | Ht 69.0 in | Wt 166.5 lb

## 2022-02-21 DIAGNOSIS — G478 Other sleep disorders: Secondary | ICD-10-CM | POA: Diagnosis not present

## 2022-02-21 DIAGNOSIS — G4733 Obstructive sleep apnea (adult) (pediatric): Secondary | ICD-10-CM | POA: Diagnosis not present

## 2022-02-21 DIAGNOSIS — R0982 Postnasal drip: Secondary | ICD-10-CM

## 2022-02-21 MED ORDER — FEXOFENADINE HCL 60 MG PO TABS: 60.0000 mg | ORAL_TABLET | Freq: Two times a day (BID) | ORAL | 5 refills | Status: DC

## 2022-02-21 NOTE — Progress Notes (Signed)
SLEEP MEDICINE CLINIC    Provider:  Larey Seat, MD  Primary Care Physician:  Crist Infante, MD [place] [address]     Referring Provider: Crist Infante, [place],  [place] [address]          Chief Complaint according to patient   Patient presents with:     RV on CPAP- difficulties with compliance . Low compliance since June 2023- here with weight loss. Misophonia, she mentioned her daughter has his condition and is having rage and anxiety - in response to auditory stimuli. She left meanwhile for college.             HISTORY OF PRESENT ILLNESS:  11/ 16/ 2023.   Katelyn Wagner is a 56 year- old- Caucasian female patient, seen on 02/21/2022 ,  She has been very resistant to CPAP use, just hates it. Here after a weight loss of 25 pounds.  6 months on " [email or web address]" and she has a Animator, with good success.  We are discussing a repeat HST to see if a PAP therapy is still needed. Epworth sleepiness score is 6 and FSS endorsed at 23/ 63.       RV from Dr Joylene Draft.  08-21-2021:  She is using CPAP with resistance, she takes sometimes off in the night. She is sweating under the mask.  She has switched from [facility] to an under the nose , N 30 by [employer].  The patient's apnea has been very well treated with a residual AHI of only 0.6/h under the 95th percentile CPAP pressure of 7.7 cm she also has few air leaks at the 95th percentile 16 L a minute, the machine is set between 5 and 15 cmH2O with 3 cm EPR.  The compliance has been 13% for 4 out of 30 days with use over 4 hours.  She has used the machine 16 out of 30 days with a compliance of 53%.  Median usage on days used is 2 hours 55 minutes. She is non compliant. Dislikes CPAP.   Fatigue severity score was endorsed at 22 points not very high and Epworth sleepiness score was endorsed at five-point also not high.   She is in the process of a divorce and may lose her health  insurance soon. BMI is 28 but she is morbidly obese around th abdomen, pear shaped. She confesses to stress eating.     This is a RV after sleep test and with the patient on CPAP therapy. The patient had been seen in her initial consultation on 1-5 2022 and had a sleep test home sleep test on 8-22 2022.  Chief concerns were misalignment related temporomandibular joint pain, frequent sinusitis cervical spine whiplash injury in the past snoring.   The AHI overall was 19.6/h , REM AHI was  41.6 which does constitute severe sleep apnea.  Patient slept mostly in prone position here the AHI was 19.8 on her back 14.9 and in left lateral position 22.8./h  Due to the REM sleep dependency, need to recommend CPAP treatment but was also careful to mention that she has temporomandibular joint disorder and it has to be made sure that her interface would not exacerbate her jaw pain or problems.  She was given an auto titration CPAP machine and her compliance for the last 30 days was only 50% but she had actually used the machine more in the 60 days prior.  She was traveling without  her CPAP earlier this month.  When she uses CPAP she only is able to use it for about 2-1/2 hours it seems the minimum pressure is 5 maximum pressure of 15 she uses a 3 cm expiratory pressure relief setting.  There is a mild not even moderate air leakage noted pressure at the 95th percentile is only 7.7 cmH2O so and really low setting and remarkably the patient's AHI apnea hypopnea index on the CPAP is 0.3 so almost complete resolution of apnea with such low pressures.   No aerophagia. I wonder how I can make it more palatable to use CPAP we will also mentioned that her fatigue score was only 14 points today the Epworth sleepiness score was only endorsed at feet 4 points today.     Initial visit - 04-2020. Chief concern according to patient : " I am not sleepy but my children reports that I snore loudly"    I have the pleasure of seeing  Katelyn Wagner , a right -handed female with a medical history of Allergy, Pain in Achilles tendon, tendonitis, and  Bilateral TMJ (dislocation of temporomandibular joint).   4 teeth were extracted to fit braces, causing a misalignment .    Sleep relevant medical history: Sinusitis, recurrent, cervical spine whiplash.  Bilateral TMJ (dislocation of temporomandibular joint).  4 teeth were extracted to fit braces, causing a misalignment .  Family medical /sleep history: No other family member on CPAP with OSA, insomnia, sleep walkers.    Social history: patient left her husband of 18 years in June 2022, now the stress in her life is less, and the divorce is filed.   Patient is working as self employed-  and lives in a household with 2 persons and 2 dogs. 2 teenage daughters. Tobacco use; none.  ETOH TKZ:SWFU, Caffeine intake in form of [employer] chocolate latte. Regular exercise in form of walking.  achilles tendonitis   Hobbies :some dancing, no longer swimming. COVID ; fully vaccinated.    Sleep habits are as follows: The patient's dinner time is between  4=5PM. The patient goes to bed at 12.30 AM and continues to sleep for 6-7 hours.   The preferred sleep position is on her sides, with the support of 1 pillow. Dreams are reportedly frequent.  7 AM is the usual rise time. The patient wakes up spontaneously.  She reports not feeling refreshed or restored in AM, with symptoms such as  morning headaches, clenching teeth causes jaw pain and tension. ,Naps are not taken , or only 20 minutes every 14 days.    Review of Systems: Out of a complete 14 system review, the patient complains of only the following symptoms, and all other reviewed systems are negative.:  Weight gain of 25 pounds - snoring   How likely are you to doze in the following situations: 0 = not likely, 1 = slight chance, 2 = moderate chance, 3 = high chance   Sitting and Reading? Watching Television? Sitting inactive in a  public place (theater or meeting)? As a passenger in a car for an hour without a break? Lying down in the afternoon when circumstances permit? Sitting and talking to someone? Sitting quietly after lunch without alcohol? In a car, while stopped for a few minutes in traffic?   Total = 6/ 24 points   FSS endorsed at 26/ 63 points.   Social History   Socioeconomic History   Marital status: Married    Spouse name: Not on file  Number of children: Not on file   Years of education: Not on file   Highest education level: Not on file  Occupational History   Not on file  Tobacco Use   Smoking status: Never   Smokeless tobacco: Never  Vaping Use   Vaping Use: Never used  Substance and Sexual Activity   Alcohol use: Not Currently   Drug use: No   Sexual activity: Not on file  Other Topics Concern   Not on file  Social History Narrative   Not on file   Social Determinants of Health   Financial Resource Strain: Not on file  Food Insecurity: Not on file  Transportation Needs: Not on file  Physical Activity: Not on file  Stress: Not on file  Social Connections: Not on file    Family History  Problem Relation Age of Onset   Dementia Mother    Colon cancer Father 21   Colon polyps Brother    Esophageal cancer Neg Hx    Stomach cancer Neg Hx    Rectal cancer Neg Hx     Past Medical History:  Diagnosis Date   Allergy    sulfer dioxide   Pain in Achilles tendon    bilateral   Sleep apnea    does not use CPAP   TMJ (dislocation of temporomandibular joint)     Past Surgical History:  Procedure Laterality Date   COLONOSCOPY  03/04/2016   Dr.Jacobs   DILATION AND CURETTAGE OF UTERUS  2010 &2005   KNEE ARTHROSCOPY  1990   left   RETINAL LASER PROCEDURE     WISDOM TOOTH EXTRACTION       Current Outpatient Medications on File Prior to Visit  Medication Sig Dispense Refill   Ascorbic Acid (VITAMIN C GUMMIE PO) Take by mouth.     ondansetron (ZOFRAN) 4 MG tablet Take  1 tablet (4 mg total) by mouth as directed. Take one Zofran pill 30-60 minutes before each colonoscopy prep dose 2 tablet 0   Pediatric Multivit-Minerals-C (GUMMI BEAR MULTIVITAMIN/MIN PO) Take by mouth.     traZODone (DESYREL) 50 MG tablet Take 1 tablet (50 mg total) by mouth at bedtime. 30 tablet 1   No current facility-administered medications on file prior to visit.    Allergies  Allergen Reactions   Sulfur Dioxide Other (See Comments)    "throat tighten"    Physical exam:  Today's Vitals   02/21/22 0844  BP: 107/62  Pulse: 61  Weight: 166 lb 8 oz (75.5 kg)  Height: '5\' 9"'$  (1.753 m)   Body mass index is 24.59 kg/m.   Wt Readings from Last 3 Encounters:  02/21/22 166 lb 8 oz (75.5 kg)  08/21/21 190 lb 8 oz (86.4 kg)  05/23/21 188 lb (85.3 kg)     Ht Readings from Last 3 Encounters:  02/21/22 '5\' 9"'$  (1.753 m)  08/21/21 '5\' 9"'$  (1.753 m)  05/23/21 '5\' 9"'$  (1.753 m)      General: The patient is awake, alert and appears not in acute distress. The patient is well groomed. Head: Normocephalic, atraumatic. Neck is supple. Mallampati 2 ,  neck circumference:15.5  inches .  Nasal airflow barely patent. Crossbite noted. Chronic sinusitis. Post nasal drip-  Dental status:  Intact Cardiovascular:  Regular rate and cardiac rhythm by pulse,  without distended neck veins. Respiratory: Lungs are clear to auscultation.  Skin:  Without evidence of ankle edema, or rash. Trunk: The patient's posture is erect.   Neurologic exam :  The patient is awake and alert, oriented to place and time.   Memory subjective described as intact.  Attention span & concentration ability appears normal.  Speech is fluent,  without  dysarthria, dysphonia or aphasia.  Mood and affect are appropriate. Stressed - custody.    Cranial nerves: no loss of smell or taste reported  Pupils are equal and briskly reactive to light.  Funduscopic exam deferred.  Extraocular movements in vertical and horizontal planes  were intact and without nystagmus. No Diplopia. Visual fields by finger perimetry are intact. Hearing was intact to soft voice and finger rubbing.   Facial sensation intact to fine touch.  Facial motor strength is symmetric and tongue and uvula move midline.  Neck ROM : rotation, tilt and flexion extension were normal for age and shoulder shrug was symmetrical.    Motor exam:  Symmetric bulk, tone and ROM.   Normal tone without cog- wheeling, symmetric grip strength .   Sensory:  Vibration was absent in the big toes, present at the ankles.  Proprioception tested in the upper extremities was normal.   Coordination: Rapid alternating movements in the fingers/hands were of normal speed.  The Finger-to-nose maneuver was intact without evidence of ataxia, dysmetria or tremor.   Gait and station: Patient could rise unassisted from a seated position, walked without assistive device.  Stance is of normal width/ base and the patient turned with 3 steps.  Toe and heel walk were deferred.  Deep tendon reflexes: deferred .      After spending a total time of 20 minutes face to face and additional time for physical and neurologic examination, review of laboratory studies,  personal review of imaging studies, reports and results of other testing and review of referral information / records as far as provided in visit, I have established the following assessments:   Low CPAP compliance turned into no -use of CPAP  since June 2023- here with successful weight loss.    1) Mrs. Linskey has a remote history of insomnia which has improved, she is not hypersomnic.  She reports getting between 6 and 7 hours of sleep usually she rarely takes daytime naps.   She has some allergies and post nasal drain.  No reports of snoring since CPAP was used either. But she dislikes CPAP and therefor started weight loss seriously.   Its better to use a nasal pillow with retrognathia, crossbite and TMJ. Needs treatment for  chronic sinus.      My Plan is to proceed with:  1) Offered Trazodone, as a sleep aid. She still has a script.   2) Offered ENT referral. - will wait and first try Fexofenadine/ Allegra- non anticholinergic anti-allergy medication. She has concerns about dementia ( family history).  3) Repeat HST now, after weight loss. BMI down by 15%. We may not need CPAP any longer- I hope    I would like to thank :  Crist Infante, [address] [place],  [place] [address] for allowing me to meet with and to take care of this pleasant patient.   I plan to follow up through our NP within 4 months.   CC: I will share my notes with  Dr. Joylene Draft.   Electronically signed by: Larey Seat, MD 02/21/2022 9:13 AM  [facility] Board certified by The [employer] of Sleep Medicine and Diplomate of the [employer] of Sleep Medicine. Board certified In Neurology through the [place], Fellow of the [employer]  of Neurology. Medical Director of [facility].

## 2022-03-12 ENCOUNTER — Telehealth: Payer: Self-pay | Admitting: Neurology

## 2022-03-12 NOTE — Telephone Encounter (Signed)
HST- [employer] no auth req.  Patient is scheduled at [facility] for 04/02/22 at 8:30 AM.  Mailed packet to the patient.

## 2022-04-02 ENCOUNTER — Ambulatory Visit: Payer: 59 | Admitting: Neurology

## 2022-04-02 DIAGNOSIS — G4733 Obstructive sleep apnea (adult) (pediatric): Secondary | ICD-10-CM | POA: Diagnosis not present

## 2022-04-02 DIAGNOSIS — G478 Other sleep disorders: Secondary | ICD-10-CM

## 2022-04-02 DIAGNOSIS — R0982 Postnasal drip: Secondary | ICD-10-CM

## 2022-04-03 NOTE — Progress Notes (Unsigned)
   [facility]  HOME SLEEP TEST (Watch PAT) REPORT  STUDY DATE: 04/02/2022  DOB: [birth year]  MRN: [id number]  ORDERING CLINICIAN: Huston Foley, MD, PhD - study interpreted on behalf of Dr. Vickey Huger   REFERRING CLINICIAN: Rodrigo Ran, MD (PCP), Dr. Vickey Huger (sleep)  CLINICAL INFORMATION/HISTORY: 56 year old female with an underlying medical history of lumbar spondylosis, and disease, TMJ dysfunction, and mildly overweight state, who presents for reevaluation of her sleep apnea with difficulty tolerating PAP therapy.  Epworth sleepiness score: 6/24.  BMI: 27.4 kg/m  FINDINGS:   Sleep Summary:   Total Recording Time (hours, min): 8 hours, 4 min  Total Sleep Time (hours, min):  7 hours, 3 min  Percent REM (%):    30.4%   Respiratory Indices:   Calculated pAHI (per hour):  24.3/hour         REM pAHI:    33.6/hour       NREM pAHI: 20.8/hour  Central pAHI: 1.8/hour  Oxygen Saturation Statistics:    Oxygen Saturation (%) Mean: 92%   Minimum oxygen saturation (%):                 84%   O2 Saturation Range (%): 84-99%    O2 Saturation (minutes) <=88%: 0.3 min  Pulse Rate Statistics:   Pulse Mean (bpm):    58/min    Pulse Range (44-105/min)   IMPRESSION: OSA (obstructive sleep apnea)  RECOMMENDATION:  This home sleep test demonstrates moderate obstructive sleep apnea with a total AHI of ***/hour and O2 nadir of ***%. *** snoring was detected. Treatment with a positive airway pressure (PAP) device is recommended. The patient will be advised to proceed with an autoPAP titration/trial at home for now. A full night titration study may be considered to optimize treatment settings, monitor proper oxygen saturations and aid with improvement of tolerance and adherence, if needed down the road. Alternative treatment options may include a dental device through dentistry or orthodontics in selected patients or Inspire (hypoglossal nerve stimulator) in  carefully selected patients (meeting inclusion criteria).  Concomitant weight loss is recommended (where clinically appropriate). Please note that untreated obstructive sleep apnea may carry additional perioperative morbidity. Patients with significant obstructive sleep apnea should receive perioperative PAP therapy and the surgeons and particularly the anesthesiologist should be informed of the diagnosis and the severity of the sleep disordered breathing. The patient should be cautioned not to drive, work at heights, or operate dangerous or heavy equipment when tired or sleepy. Review and reiteration of good sleep hygiene measures should be pursued with any patient. Other causes of the patient's symptoms, including circadian rhythm disturbances, an underlying mood disorder, medication effect and/or an underlying medical problem cannot be ruled out based on this test. Clinical correlation is recommended.  The patient and *** referring provider will be notified of the test results. The patient will be seen in follow up in sleep clinic at [facility].  I certify that I have reviewed the raw data recording prior to the issuance of this report in accordance with the standards of the American Academy of Sleep Medicine (AASM).    INTERPRETING PHYSICIAN:   Huston Foley, MD, PhD Medical Director, [facility]) Diplomat, ABPN (Neurology and Sleep)   [facility] [address], [address] [place], [place] [address] [phone]

## 2022-04-10 ENCOUNTER — Telehealth: Payer: Self-pay | Admitting: Neurology

## 2022-04-10 NOTE — Procedures (Signed)
   [facility]  HOME SLEEP TEST (Watch PAT) REPORT  STUDY DATE: 04/02/2022  DOB: [birth year]  MRN: [id number]  ORDERING CLINICIAN: Star Age, MD, PhD - study interpreted on behalf of Dr. Brett Fairy   REFERRING CLINICIAN: Crist Infante, MD (PCP), Dr. Brett Fairy (sleep)  CLINICAL INFORMATION/HISTORY: 57 year old female with an underlying medical history of lumbar spondylosis, and disease, TMJ dysfunction, and mildly overweight state, who presents for reevaluation of her sleep apnea with difficulty tolerating PAP therapy.  Epworth sleepiness score: 6/24.  BMI: 27.4 kg/m  FINDINGS:   Sleep Summary:   Total Recording Time (hours, min): 8 hours, 4 min  Total Sleep Time (hours, min):  7 hours, 3 min  Percent REM (%):    30.4%   Respiratory Indices:   Calculated pAHI (per hour):  24.3/hour         REM pAHI:    33.6/hour       NREM pAHI: 20.8/hour  Central pAHI: 1.8/hour  Oxygen Saturation Statistics:    Oxygen Saturation (%) Mean: 92%   Minimum oxygen saturation (%):                 84%   O2 Saturation Range (%): 84-99%    O2 Saturation (minutes) <=88%: 0.3 min  Pulse Rate Statistics:   Pulse Mean (bpm):    58/min    Pulse Range (44-105/min)   IMPRESSION: OSA (obstructive sleep apnea)  RECOMMENDATION:  This home sleep test demonstrates moderate obstructive sleep apnea with a total AHI of 24.3/hour and O2 nadir of 84%.  Mild to moderate snoring was detected.  Ongoing treatment with a positive airway pressure (PAP) device is recommended.  The patient has a current AutoPap machine and I would recommend that she continue with treatment for now.  If she has significant tolerance issues, one could consider bringing her in for a designated CPAP titration study.   Alternative treatment options may include a dental device through dentistry or orthodontics in selected patients or Inspire (hypoglossal nerve stimulator) in carefully selected patients (meeting  inclusion criteria).  Concomitant weight loss is recommended (where clinically appropriate). Please note that untreated obstructive sleep apnea may carry additional perioperative morbidity. Patients with significant obstructive sleep apnea should receive perioperative PAP therapy and the surgeons and particularly the anesthesiologist should be informed of the diagnosis and the severity of the sleep disordered breathing. The patient should be cautioned not to drive, work at heights, or operate dangerous or heavy equipment when tired or sleepy. Review and reiteration of good sleep hygiene measures should be pursued with any patient. Other causes of the patient's symptoms, including circadian rhythm disturbances, an underlying mood disorder, medication effect and/or an underlying medical problem cannot be ruled out based on this test. Clinical correlation is recommended.  The patient and her referring provider will be notified of the test results. The patient will be seen in follow up in sleep clinic at [facility].  I certify that I have reviewed the raw data recording prior to the issuance of this report in accordance with the standards of the American Academy of Sleep Medicine (AASM).  INTERPRETING PHYSICIAN:   Star Age, MD, PhD Medical Director, [facility]) [place], ABPN (Neurology and Sleep)   [facility] [address] [place],  [address] [phone]

## 2022-04-10 NOTE — Telephone Encounter (Signed)
This patient saw Dr. Brett Fairy for sleep evaluation on 02/21/2022.  She has been on AutoPap therapy with difficulty with tolerance and presented for reevaluation with a home sleep test.  I read the home sleep test from 04/02/2022 on Dr. Edwena Felty behalf:  Please advise patient that her home sleep test indicated moderate obstructive sleep apnea.  I recommend ongoing treatment with her AutoPap machine.  We can request a mask refit through her DME company.  Ultimately, we can also consider bringing her in for a designated CPAP titration study which would be an overnight laboratory study in our sleep lab.  Let me know how she would like to proceed, she can also make a follow-up appointment with Dr. Brett Fairy for the next available appointment to discuss further.

## 2022-04-11 NOTE — Telephone Encounter (Signed)
Called pt at [phone]. Relayed results per Dr. Guadelupe Sabin note. Pt saw in sleep study report that Dr. Rexene Alberts mentioned alternate treatment option of dental device. Advised this typically is not an option for moderate sleep apnea but will clarify with her. Pt would prefer to not go back on CPAP d/t difficulty she had tolerating in the past.   She plans to also lose another 25lb. Wondering how much weight she would have to lose to have another repeat study.   She also wants her BMI fixed in sleep study report. Sleep study shows BMI: 27.4 but visit from 02/21/22 showed 24.58. She would like Dr. Rexene Alberts to fix this. She lost a lot of weight and wants this properly documented.   Scheduled f/u with Dr. Brett Fairy 07/03/22 at 8:30am.

## 2022-04-11 NOTE — Telephone Encounter (Signed)
Called pt back. Relayed Dr. Guadelupe Sabin message. She has dental appt today with her dentist. She is going to see if they can make dental device. If not, she will call back for [place] to place referral to dentist that does.   I placed IT ticket on Dr. Guadelupe Sabin behalf per request to get BMI updated.

## 2022-04-11 NOTE — Telephone Encounter (Signed)
If she would like a referral to dentistry, we can facilitate in Dr. Edwena Felty absence.  She can also wait and discuss with Dr. Brett Fairy upon her return if she would like.  I have made an addendum to the home sleep test report but it shows up under notes, not in the procedure itself, please let patient know.    Please place a ticket for this with IT so they can correct the BMI on my behalf, I cannot go into the signed report again but made an addendum with the corrected BMI.

## 2022-04-16 ENCOUNTER — Ambulatory Visit (INDEPENDENT_AMBULATORY_CARE_PROVIDER_SITE_OTHER): Payer: 59 | Admitting: Sports Medicine

## 2022-04-16 VITALS — BP 102/62 | Ht 69.0 in | Wt 163.0 lb

## 2022-04-16 DIAGNOSIS — G8929 Other chronic pain: Secondary | ICD-10-CM

## 2022-04-16 DIAGNOSIS — M545 Low back pain, unspecified: Secondary | ICD-10-CM | POA: Diagnosis not present

## 2022-04-16 DIAGNOSIS — M25512 Pain in left shoulder: Secondary | ICD-10-CM | POA: Diagnosis not present

## 2022-04-16 NOTE — Assessment & Plan Note (Signed)
Recommend patient begin some low back exercises and stretches, she was given flexion series handout on these are described by her athletic trainer here today to be performed a couple times a week.  Recommended patient also return to PT Pilates if that seems to help her be more reliable with her exercises.  This regimen should help her continue the routine for maintenance of her back.  Follow-up as needed

## 2022-04-16 NOTE — Assessment & Plan Note (Signed)
Patient's physical exam is relatively benign at this time however she would likely benefit with some rotator cuff exercises.  She was given a handout to describe these exercises by our athletic trainer here in office.  Patient should perform these daily until her pain subsides.  Follow-up as needed.  If she is having trouble adhering to this regimen we can only send her for formal physical therapy if she desires.

## 2022-04-16 NOTE — Progress Notes (Signed)
New Patient Office Visit  Subjective   Patient ID: Katelyn Wagner, female    DOB: [birth year]  Age: 57 y.o. MRN: [id number]  Low back pain, shoulder pain.  Katelyn Wagner is a new patient to our clinic who presents today with chief complaint of right-sided low back pain after a fall in December.  She reports she had a fall last year that gave her similar pain however it persisted.  At that time she had an MRI 6/23 that revealed small annular tear and degenerative changes.  Her pain eventually subsided however she has had a couple episodes that have flared her back up most recently the fall in December.  Since scheduling this appointment she has had resolution of her pain and she is here today wondering about some maintenance reviewed she should be trying.  She would also like to discuss her chronic shoulder pain.  Patient is an avid swimmer and never has had to stop secondary to this discomfort in her shoulder.  Most of her pain occurs with overhead activity.  She denies any acute injury or trauma to her shoulder.  Her pain is located mainly in the posterior aspect of her shoulder.  She denies any numbness or tingling or pain radiating down into her hand.   ROS as listed above in HPI    Objective:     BP 102/62   Ht '5\' 9"'$  (1.753 m)   Wt 163 lb (73.9 kg)   BMI 24.07 kg/m   Physical Exam Vitals reviewed.  Constitutional:      General: She is not in acute distress.    Appearance: Normal appearance. She is not ill-appearing, toxic-appearing or diaphoretic.  Pulmonary:     Effort: Pulmonary effort is normal.  Neurological:     Mental Status: She is alert.   Low back: No obvious deformity or asymmetry.  She has great range of motion at her hip.  Negative straight leg raise testing.  Normal gait. Left shoulder: No obvious deformity or asymmetry.  No ecchymosis or edema.  She does have some tenderness to palpation in her upper trapezius muscles.  She has full range of motion with forward  flexion, abduction, internal and external rotation.  Negative empty can testing.  Negative impingement testing, Hawkins and Neer.  Strength 5/5 resisted forward flexion internal and external rotation.  Pain was not reproducible today.  She is equivocal scapular motion and compared right to left side.  Grip strength 5/5.  Radial pulse 2+.     Assessment & Plan:   Problem List Items Addressed This Visit       Other   Chronic right-sided low back pain - Primary    Recommend patient begin some low back exercises and stretches, she was given flexion series handout on these are described by her athletic trainer here today to be performed a couple times a week.  Recommended patient also return to PT Pilates if that seems to help her be more reliable with her exercises.  This regimen should help her continue the routine for maintenance of her back.  Follow-up as needed      Pain in joint of left shoulder    Patient's physical exam is relatively benign at this time however she would likely benefit with some rotator cuff exercises.  She was given a handout to describe these exercises by our athletic trainer here in office.  Patient should perform these daily until her pain subsides.  Follow-up as needed.  If she  is having trouble adhering to this regimen we can only send her for formal physical therapy if she desires.       No follow-ups on file.    Elmore Guise, DO  I observed and examined the patient with the [facility] resident and agree with assessment and plan.  Note reviewed and modified by me. Ila Mcgill, MD

## 2022-07-03 ENCOUNTER — Encounter: Payer: Self-pay | Admitting: Neurology

## 2022-07-03 ENCOUNTER — Ambulatory Visit (INDEPENDENT_AMBULATORY_CARE_PROVIDER_SITE_OTHER): Payer: 59 | Admitting: Neurology

## 2022-07-03 VITALS — BP 121/70 | HR 57 | Ht 69.0 in | Wt 168.0 lb

## 2022-07-03 DIAGNOSIS — G4733 Obstructive sleep apnea (adult) (pediatric): Secondary | ICD-10-CM | POA: Insufficient documentation

## 2022-07-03 DIAGNOSIS — R0683 Snoring: Secondary | ICD-10-CM | POA: Insufficient documentation

## 2022-07-03 MED ORDER — MOMETASONE FUROATE 50 MCG/ACT NA SUSP: 2.0000 | Freq: Every day | NASAL | 12 refills | Status: DC

## 2022-07-03 MED ORDER — GUAIFENESIN ER 600 MG PO TB12: 600.0000 mg | ORAL_TABLET | Freq: Two times a day (BID) | ORAL | Status: AC

## 2022-07-03 NOTE — Progress Notes (Addendum)
Provider:  Larey Seat, MD  Primary Care Physician:  Crist Infante, MD [address] [place] [address]     Referring Provider: Crist Infante, [place],  [place] [address]          Chief Complaint according to patient   Patient presents with:     New Patient (Initial Visit)           HISTORY OF PRESENT ILLNESS:  Katelyn Wagner is a 57 y.o. female patient who is here for revisit 07/03/2022 for  discussion of HST results. .  Chief concern according to patient :  DO I NEED TO USE MY CPAP ?      RV on CPAP- difficulties with compliance . Low compliance since June 2023- here with weight loss. HST repeat did show increase in AHI , not the expected decrease. Sinusitis may be the cause.               HISTORY OF PRESENT ILLNESS:   07-03-2022: chronic sinus congestion but had a nasal congestion and postnasal drip in December , took sudafed the night of the HST and felt it made no difference and she was surprised to see her apnea had increased - in spite of weight loss. Thee was a high proportion of REM sleep and 66% of REM sleep was supine , correlating with higher AHI.  We discussed repeating the HST and to treat her sinus congestion- giving her a pressure sensation in the forehead and the postnasal drip.  "This home sleep test demonstrates moderate obstructive sleep apnea with a total AHI of 24.3/hour and O2 nadir of 84%. Mild to moderate snoring was detected. Ongoing treatment with a positive airway pressure (PAP) device is recommended. The patient has a current AutoPap machine and I would recommend that she continue with treatment for now. If she has significant tolerance issues, one could consider bringing her in for a designated CPAP titration study. "    11/ 16/ 2023.    Katelyn Wagner is a 57 year- old- Caucasian female patient, seen on 02/21/2022 ,  She has been very resistant to CPAP use, just hates it. Here after a weight loss  of 25 pounds.  6 months on " [email or web address]" and she has a Animator, with good success.  We are discussing a repeat HST to see if a PAP therapy is still needed. Epworth sleepiness score is 6 and FSS endorsed at 23/ 63.        RV from Dr Joylene Draft.  08-21-2021:  She is using CPAP with resistance, she takes sometimes off in the night. She is sweating under the mask.  She has switched from [facility] to an under the nose , N 30 by [employer].  The patient's apnea has been very well treated with a residual AHI of only 0.6/h under the 95th percentile CPAP pressure of 7.7 cm she also has few air leaks at the 95th percentile 16 L a minute, the machine is set between 5 and 15 cmH2O with 3 cm EPR.  The compliance has been 13% for 4 out of 30 days with use over 4 hours.  She has used the machine 16 out of 30 days with a compliance of 53%.  Median usage on days used is 2 hours 55 minutes. She is non compliant. Dislikes CPAP.   Fatigue severity score was endorsed at 22 points not very high and Epworth sleepiness  score was endorsed at five-point also not high.   She is in the process of a divorce and may lose her health insurance soon. BMI is 28 but she is morbidly obese around th abdomen, pear shaped. She confesses to stress eating.            Review of Systems: Out of a complete 14 system review, the patient complains of only the following symptoms, and all other reviewed systems are negative.:  Fatigue, sleepiness , snoring, fragmented sleep, How likely are you to doze in the following situations: 0 = not likely, 1 = slight chance, 2 = moderate chance, 3 = high chance   Sitting and Reading? Watching Television? Sitting inactive in a public place (theater or meeting)? As a passenger in a car for an hour without a break? Lying down in the afternoon when circumstances permit? Sitting and talking to someone? Sitting quietly after lunch without alcohol? In a car, while stopped for a few minutes in  traffic?   Total = 4/ 24 points   FSS endorsed at 18/ 63 points.   Social History   Socioeconomic History   Marital status: Married    Spouse name:  Separated    Number of children: Not on file   Years of education: Not on file   Highest education level: Not on file  Occupational History   Not on file  Tobacco Use   Smoking status: Never   Smokeless tobacco: Never  Vaping Use   Vaping Use: Never used  Substance and Sexual Activity   Alcohol use: Not Currently   Drug use: No   Sexual activity: Not on file  Other Topics Concern   Not on file  Social History Narrative   Not on file   Social Determinants of Health   Financial Resource Strain: Not on file  Food Insecurity: Not on file  Transportation Needs: Not on file  Physical Activity: Not on file  Stress: Not on file  Social Connections: Not on file    Family History  Problem Relation Age of Onset   Dementia Mother    Colon cancer Father 33   Colon polyps Brother    Esophageal cancer Neg Hx    Stomach cancer Neg Hx    Rectal cancer Neg Hx     Past Medical History:  Diagnosis Date   Allergy    sulfer dioxide   Pain in Achilles tendon    bilateral   Sleep apnea    does not use CPAP   TMJ (dislocation of temporomandibular joint)     Past Surgical History:  Procedure Laterality Date   COLONOSCOPY  03/04/2016   Dr.Jacobs   DILATION AND CURETTAGE OF UTERUS  2010 &2005   KNEE ARTHROSCOPY  1990   left   RETINAL LASER PROCEDURE     WISDOM TOOTH EXTRACTION       Current Outpatient Medications on File Prior to Visit  Medication Sig Dispense Refill   Ascorbic Acid (VITAMIN C GUMMIE PO) Take by mouth.     No current facility-administered medications on file prior to visit.    Allergies  Allergen Reactions   Sulfur Dioxide Other (See Comments)    "throat tighten"     DIAGNOSTIC DATA (LABS, IMAGING, TESTING) - I reviewed patient records, labs, notes, testing and imaging myself where  available.  No results found for: "WBC", "HGB", "HCT", "MCV", "PLT" No results found for: "NA", "K", "CL", "CO2", "GLUCOSE", "BUN", "CREATININE", "CALCIUM", "PROT", "ALBUMIN", "AST", "ALT", "  ALKPHOS", "BILITOT", "GFRNONAA", "GFRAA" No results found for: "CHOL", "HDL", "LDLCALC", "LDLDIRECT", "TRIG", "CHOLHDL" No results found for: "HGBA1C" No results found for: "VITAMINB12" No results found for: "TSH"  PHYSICAL EXAM:  Today's Vitals   07/03/22 0833  BP: 121/70  Pulse: (!) 57  Weight: 168 lb (76.2 kg)  Height: 5\' 9"  (1.753 m)   Body mass index is 24.81 kg/m.   Wt Readings from Last 3 Encounters:  07/03/22 168 lb (76.2 kg)  04/16/22 163 lb (73.9 kg)  02/21/22 166 lb 8 oz (75.5 kg)     Ht Readings from Last 3 Encounters:  07/03/22 5\' 9"  (1.753 m)  04/16/22 5\' 9"  (1.753 m)  02/21/22 5\' 9"  (1.753 m)      General:  The patient is awake, alert and appears not in acute distress. The patient is well groomed. Head: Normocephalic, atraumatic. Neck is supple. Mallampati 2 ,  neck circumference:15.5  inches .  Nasal airflow barely patent. Crossbite noted. Chronic sinusitis. Post nasal drip-  Dental status:  Intact Cardiovascular:  Regular rate and cardiac rhythm by pulse,  without distended neck veins. Respiratory: Lungs are clear to auscultation.  Skin:  Without evidence of ankle edema, or rash. Trunk: The patient's posture is erect.   Neurologic exam : The patient is awake and alert, oriented to place and time.   Memory subjective described as intact.  Attention span & concentration ability appears normal.  Speech is fluent,  without  dysarthria, dysphonia or aphasia.  Mood and affect are appropriate. Stressed - custody.    Cranial nerves: no loss of smell or taste reported  Pupils are equal and briskly reactive to light.  Funduscopic exam deferred.  Extraocular movements in vertical and horizontal planes were intact and without nystagmus. No Diplopia. Visual fields by  finger perimetry are intact. Hearing was reduced. Facial sensation intact to fine touch.  Facial motor strength is symmetric and tongue and uvula move midline.  Neck ROM : rotation, tilt and flexion extension were normal for age and shoulder shrug was symmetrical.    Motor exam:  Symmetric bulk, tone and ROM.   Normal tone without cog- wheeling, symmetric grip strength .   Sensory:  Vibration was absent in the big toes, present at the ankles.  Proprioception tested in the upper extremities was normal.   Coordination: Rapid alternating movements in the fingers/hands were of normal speed.  The Finger-to-nose maneuver was intact without evidence of ataxia, dysmetria or tremor.   Gait and station: Patient could rise unassisted from a seated position, walked without assistive device.  Stance is of normal width/ base and the patient turned with 3 steps.  Toe and heel walk were deferred.  Deep tendon reflexes: deferred            ASSESSMENT AND PLAN:  57 y.o. year old female  here with:    1) moderate OSA according to HST from  03-2022 - but patient feels this was due to congestion and sinusitis ate the time of testing.  Disappointed that her mild apnea was increased to moderate in spite of weight loss.    2)Resume CPAP, Please  for now concentrating on treating allergies and congestion with mucinex and with hydration, also nasal spray before CPAP use at night. Allegra in daytime is usually not associated with drowsiness.   3) for now continue to use CPAP in 14 days for at least a 30 day period- , we will get a new download and I will re-order a  sleep test with open nasal passages in 14 days. Marland Kitchen  HST in the next 30 days.      I plan to follow up either personally or through our NP within 6 months.   I would like to thank Crist Infante, MD for allowing me to meet with and to take care of this pleasant patient.   CC: I will share my notes with PCP.  After spending a total time of   20  minutes face to face and additional time for physical and neurologic examination, review of laboratory studies,  personal review of imaging studies, reports and results of other testing and review of referral information / records as far as provided in visit,   Electronically signed by: Larey Seat, MD 07/03/2022 8:39 AM  [facility] and [facility] Sleep Board certified by The [employer] of Sleep Medicine and Diplomate of the [employer] of Sleep Medicine. Board certified In Neurology through the [place], Fellow of the [employer] of Neurology. Medical Director of [employer].

## 2022-07-03 NOTE — Patient Instructions (Signed)
1) moderate OSA according to HST from 03-2022 - increased AHI in spite of weight loss , but this was likely due to congestion and sinusitis ate the time of testing.  Disappointed that her mild apnea was increased to moderate in spite of weight loss.    2)Resume CPAP, Please  for now concentrating on treating allergies and congestion with mucinex and with hydration, also nasal spray before CPAP use at night. Allegra in daytime is usually not associated with drowsiness.   3) for now continue to use CPAP in 14 days for at least a 30 day period- , we will get a new download and I will re-order a sleep test with open nasal passages in 14 days. Marland Kitchen  HST in the next 14- 30 days.

## 2022-07-04 ENCOUNTER — Telehealth: Payer: Self-pay | Admitting: Neurology

## 2022-07-04 NOTE — Telephone Encounter (Signed)
07/03/22 left VM KS  07/03/22 [employer] no Josem Kaufmann req

## 2022-10-01 ENCOUNTER — Ambulatory Visit: Payer: 59 | Admitting: Sports Medicine

## 2022-10-01 VITALS — BP 112/70 | Ht 68.5 in | Wt 162.0 lb

## 2022-10-01 DIAGNOSIS — M7662 Achilles tendinitis, left leg: Secondary | ICD-10-CM | POA: Diagnosis not present

## 2022-10-01 DIAGNOSIS — M7661 Achilles tendinitis, right leg: Secondary | ICD-10-CM

## 2022-10-01 DIAGNOSIS — G5701 Lesion of sciatic nerve, right lower limb: Secondary | ICD-10-CM | POA: Insufficient documentation

## 2022-10-01 NOTE — Assessment & Plan Note (Signed)
We will use icing and she uses Arnica Migraine Hx and not a good choice for NTG  HEP with calf raises Good shoe support and heel lifts

## 2022-10-01 NOTE — Assessment & Plan Note (Signed)
Given a series of HEP to focus on ER of the hip 2 piriformis stretches Ice massage/ ball OTC meds  F/U if not resolving

## 2022-10-01 NOTE — Progress Notes (Signed)
PCP: Rodrigo Ran, MD  Subjective:   HPI: Patient is a 57 y.o. female here for right lateral hip pain and achilles pain.  Pain started about 1-2 months ago was walking more and trying to increase pace of walking. Has had achilles pain for many years and stopped swimming about 1 year with much improvement until about 1 month ago. She walks about 10K step daily. Pain with lateral hip with crossing leg to put on shoes, sitting or long periods with pain radiating to the knee. Achilles pain is worse on the right ankle. Wore a boot yesterday with dramatic improvement in achilles pain. She is taking ibuprofen 800 mg 2-3 times daily for pain. Is going to [place] later today. Planing a hiking trip to [place] in about 1 month. Does wear shoe inserts with heel lifts.  Past Medical History:  Diagnosis Date   Allergy    sulfer dioxide   Pain in Achilles tendon    bilateral   Sleep apnea    does not use CPAP   TMJ (dislocation of temporomandibular joint)     Current Outpatient Medications on File Prior to Visit  Medication Sig Dispense Refill   Ascorbic Acid (VITAMIN C GUMMIE PO) Take by mouth.     guaiFENesin (MUCINEX) 600 MG 12 hr tablet Take 1 tablet (600 mg total) by mouth 2 (two) times daily.     mometasone (NASONEX) 50 MCG/ACT nasal spray Place 2 sprays into the nose daily. Use in PM before CPAP . 1 each 12   No current facility-administered medications on file prior to visit.    Past Surgical History:  Procedure Laterality Date   COLONOSCOPY  03/04/2016   Dr.Jacobs   DILATION AND CURETTAGE OF UTERUS  2010 &2005   KNEE ARTHROSCOPY  1990   left   RETINAL LASER PROCEDURE     WISDOM TOOTH EXTRACTION      Allergies  Allergen Reactions   Sulfur Dioxide Other (See Comments)    "throat tighten"    BP 112/70   Ht 5' 8.5" (1.74 m)   Wt 162 lb (73.5 kg)   BMI 24.27 kg/m       No data to display              No data to display              Objective:  Physical  Exam:  Gen: NAD, comfortable in exam room  Right hip No deformity of ecchymosis. TTP of the right piriformis  FROM of the right hip, decreased strength of right hip external rotation compared to left Positive FABER of the lateral hip, no pain with FDIR Negative log roll Piriformis stretches increase pain  Feet Normal longitudinal arches, no hindfoot valgus abnormalities Mild splaying of 1-2 toes bilaterally Enlarged nodular achilles bilaterally with mid pain on palpation Normal anterior drawer and talar tilt  NV intact distally   Assessment & Plan:  1. Right hip pain- likely piriformis strain. Home exercises and stretches provided. Icing and ibuprofen for pain. Activity modification with walking as tolerated with pain.  2. 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 Heel lifts with shoes. Home exercises provided. Ice 2-3 times daily. Ibuprofen as needed for pain.   I observed and examined the patient with the resident and agree with assessment and plan.  Note reviewed and modified by me. Sterling Big, MD

## 2022-10-18 ENCOUNTER — Telehealth: Payer: Self-pay

## 2022-10-18 NOTE — Telephone Encounter (Signed)
Discussed with pt to stop doing the piriformis stretch/exercises that are causing pain. For the next several days she will take ibuprofen, ice several times a day and do only the hip rotations and clam shells every other day. She was given an alternate piriformis stretch to try and will do that as long as it doesn't cause pain. She will call or message [place] next Wednesday with an update. She is going out of town on 7/29 and hoping to do a lot of hiking. So if we need to get her back in for a f/u before then we can.  Pt understand and agrees with the plan.

## 2022-11-14 ENCOUNTER — Ambulatory Visit: Payer: 59 | Admitting: Sports Medicine

## 2022-11-26 ENCOUNTER — Ambulatory Visit: Payer: 59

## 2022-11-26 ENCOUNTER — Encounter: Payer: Self-pay | Admitting: Podiatry

## 2022-11-26 ENCOUNTER — Ambulatory Visit (INDEPENDENT_AMBULATORY_CARE_PROVIDER_SITE_OTHER): Payer: 59 | Admitting: Podiatry

## 2022-11-26 DIAGNOSIS — M7662 Achilles tendinitis, left leg: Secondary | ICD-10-CM

## 2022-11-26 DIAGNOSIS — M7661 Achilles tendinitis, right leg: Secondary | ICD-10-CM

## 2022-11-26 DIAGNOSIS — B070 Plantar wart: Secondary | ICD-10-CM | POA: Diagnosis not present

## 2022-11-26 DIAGNOSIS — M47816 Spondylosis without myelopathy or radiculopathy, lumbar region: Secondary | ICD-10-CM | POA: Insufficient documentation

## 2022-11-26 DIAGNOSIS — F418 Other specified anxiety disorders: Secondary | ICD-10-CM | POA: Insufficient documentation

## 2022-11-26 DIAGNOSIS — M5136 Other intervertebral disc degeneration, lumbar region: Secondary | ICD-10-CM | POA: Insufficient documentation

## 2022-11-26 MED ORDER — FLUOROURACIL 5 % EX CREA: TOPICAL_CREAM | Freq: Two times a day (BID) | CUTANEOUS | 1 refills | Status: AC

## 2022-11-26 NOTE — Patient Instructions (Signed)

## 2022-11-26 NOTE — Progress Notes (Signed)
She presents today after having not seen anyone here for a little more than a year she is currently requesting new orthotics for her Planter fasciitis and her Achilles tendinitis.  She thinks she may have warts on the bottom of her feet as well.  Objective: Vital signs are stable she is alert and oriented x 3 she has some tenderness on palpation of the medial calcaneal tubercles bilaterally she has tenderness on palpation of the Achilles and into the Achilles proper.  She also has a retro-Achilles bursitis of the left heel.  To the plantar aspect of the foot she does demonstrate 2 small verrucoid lesions both beneath the fourth metatarsals but demonstrating thrombosed capillaries.  Skin lines are circumvent the lesion so this is consistent with verruca plantaris.  She also has 1 on the distal plantar lateral aspect of the hallux that is almost impossible to see.  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  Plan: Chemical destruction verruca plantaris bilaterally and this was performed with Cantharone under occlusion to be washed off thoroughly tomorrow also I provided her with a prescription for Efudex cream to be applied twice daily.  She was also scanned for new orthotics today for her Achilles tendinitis and plantar fasciitis.

## 2022-11-28 NOTE — Progress Notes (Signed)
  Patient was seen, measured for custom molded foot orthotics patient has hx of Achilles tendonitis needing 1/4" lifts added to orthotics to reduce stress  Patient will also benefit from CFO's as they will help provide total contact to MLA's helping to better distribute body weight across BIL feet greater reducing plantar pressure and pain and to also encourage FF and RF alignment  Patient was scanned items to be ordered and fit when in   [employer], CFo, CFm

## 2022-12-12 ENCOUNTER — Ambulatory Visit: Payer: 59 | Admitting: Neurology

## 2022-12-24 ENCOUNTER — Ambulatory Visit: Payer: 59

## 2022-12-24 NOTE — Progress Notes (Signed)
Patient was here to PU new orthotics  Inserts were not right too short not wide enough nor was the heel deep enough patient refused to take  I rescanned patient and will have a new pair made by langer  Order placed today  Addison Bailey Cped, CFo, CFm

## 2023-01-07 ENCOUNTER — Ambulatory Visit: Payer: 59 | Admitting: Podiatry

## 2023-02-17 ENCOUNTER — Ambulatory Visit: Payer: 59 | Admitting: Neurology

## 2023-02-25 ENCOUNTER — Other Ambulatory Visit: Payer: 59

## 2023-02-28 ENCOUNTER — Ambulatory Visit: Payer: 59

## 2023-02-28 NOTE — Progress Notes (Signed)
Patient presents today to pick up custom molded foot orthotics, diagnosed with Achilles Tendonitis by Dr. Al Corpus.   Orthotics were dispensed and fit was satisfactory. Reviewed instructions for break-in and wear. Written instructions given to patient.  Patient will follow up as needed.   Katelyn Wagner Cped, CFo, CFo

## 2023-03-03 ENCOUNTER — Ambulatory Visit (INDEPENDENT_AMBULATORY_CARE_PROVIDER_SITE_OTHER): Payer: 59 | Admitting: Neurology

## 2023-03-03 DIAGNOSIS — G4733 Obstructive sleep apnea (adult) (pediatric): Secondary | ICD-10-CM

## 2023-03-03 DIAGNOSIS — R0683 Snoring: Secondary | ICD-10-CM

## 2023-03-04 NOTE — Progress Notes (Signed)
[facility]   Katelyn Wagner Female, 57 y.o., 16-Jul-1965 MRN: [id number]   HOME SLEEP TEST REPORT ( by Watch PAT)   STUDY DATE:  03-04-2023   ORDERING CLINICIAN: Melvyn Novas, MD  REFERRING CLINICIAN: Rodrigo Ran, MD    CLINICAL INFORMATION/HISTORY: Katelyn Wagner is a 57 y.o. female patient who is here for revisit 07/03/2022 for  discussion of HST results. .  Chief concern according to patient :  Do I need CPAP ?          RV on CPAP- difficulties with compliance . Low compliance since June 2023- here with weight loss. HST repeat did show increase in AHI , not the expected decrease. Sinusitis may be the cause.                07-03-2022: chronic sinus congestion but had a nasal congestion and postnasal drip in December , took sudafed the night of the HST and felt it made no difference and she was surprised to see her apnea had increased - in spite of weight loss. Thee was a high proportion of REM sleep and 66% of REM sleep was supine , correlating with higher AHI.  We discussed repeating the HST and to treat her sinus congestion- giving her a pressure sensation in the forehead and the postnasal drip.   "This home sleep test demonstrates moderate obstructive sleep apnea with a total AHI of 24.3/hour and O2 nadir of 84%. Mild to moderate snoring was detected. Ongoing treatment with a positive airway pressure (PAP) device is recommended. The patient has a current AutoPap machine and I would recommend that she continue with treatment for now. If she has significant tolerance issues, one could consider bringing her in for a designated CPAP titration study. "       Epworth sleepiness score:  4/ 24 points   FSS endorsed at 18/ 63 points.  Off CPAP.    BMI:  27 kg/m   Neck Circumference: 15.5"    FINDINGS:   Sleep Summary:   Total Recording Time (hours, min): 7 hours 35 minutes      Total Sleep Time (hours, min): 6 hours 24 minutes               Percent  REM (%):      30%                                  Respiratory Indices by AASM :   Calculated pAHI (per hour): 16/h                           REM pAHI:     20.4/h                                            NREM pAHI:    14.2/h                          Supine AHI:    AHI was 16.4/h in supine sleep the patient was 310 minutes on her back.  There were only 62 minutes on left sided sleep recorded here the AHI was also 17.3/h.  Snoring level reached a volume of 43 dB,  snoring was present for ca. 50% of the total sleep time.                                               Oxygen Saturation Statistics:   Oxygen Saturation (%) Mean:     93%          O2 Saturation Range (%):   Between the nadir at 83 and the maximal saturation of 98%                                    O2 Saturation (minutes) <89%:       0.1-minute  Pulse Rate Statistics:   Pulse Mean (bpm):               51 bpm Pulse Range:      Between 40 and 82 bpm           IMPRESSION:  This HST confirms the presence of mild to moderate sleep apnea of strictly obstructive origin.  No central apneas were recorded, there was an accentuation in REM sleep seen.  This was not a REM dependent sleep apnea.  There was no associated hypoxia noted.  Interestingly , some of the loudest snoring was seen while the patient slept on her left side. Her current AHI is exactly at the border point between mild and moderate sleep apnea.   RECOMMENDATION: This patient has started with nasal airflow, crossbite, chronic sinusitis postnasal drip.  I think her snoring would be better treated by addressing nasal patency and allergies.  If the patient is truly CPAP fatigued I would consider a sleep dentistry intervention could be of benefit.  If she is reluctantly willing to stay on CPAP I would still want her to use CPAP but with the treatment allowing a better nasal airway patency.  I would not change any of the settings.     INTERPRETING PHYSICIAN:   Melvyn Novas, MD   [facility] and [facility] Sleep Board certified by The [employer] of Sleep Medicine and Diplomate of the [employer] of Sleep Medicine. Board certified In Neurology through the [employer], Fellow of the [employer] of Neurology.

## 2023-03-16 NOTE — Procedures (Signed)
Katelyn Wagner at [facility]   Katelyn Wagner Female, 57 y.o., 16-Jul-1965 MRN: [id number]   HOME Wagner TEST REPORT ( by Watch PAT)   STUDY DATE:  03-04-2023   ORDERING CLINICIAN: Melvyn Novas, MD  REFERRING CLINICIAN: Rodrigo Ran, MD    CLINICAL INFORMATION/HISTORY: Katelyn Wagner is a 57 y.o. female patient who is here for revisit 07/03/2022 for  discussion of HST results. .  Chief concern according to patient :  Do I need CPAP ?          RV on CPAP- difficulties with compliance . Low compliance since June 2023- here with weight loss. HST repeat did show increase in AHI , not the expected decrease. Sinusitis may be the cause.                07-03-2022: chronic sinus congestion but had a nasal congestion and postnasal drip in December , took sudafed the night of the HST and felt it made no difference and she was surprised to see her apnea had increased - in spite of weight loss. Thee was a high proportion of REM Wagner and 66% of REM Wagner was supine , correlating with higher AHI.  We discussed repeating the HST and to treat her sinus congestion- giving her a pressure sensation in the forehead and the postnasal drip.   "This home Wagner test demonstrates moderate obstructive Wagner apnea with a total AHI of 24.3/hour and O2 nadir of 84%. Mild to moderate snoring was detected. Ongoing treatment with a positive airway pressure (PAP) device is recommended. The patient has a current AutoPap machine and I would recommend that she continue with treatment for now. If she has significant tolerance issues, one could consider bringing her in for a designated CPAP titration study. "       Epworth sleepiness score:  4/ 24 points   FSS endorsed at 18/ 63 points.  Off CPAP.    BMI:  27 kg/m   Neck Circumference: 15.5"    FINDINGS:   Wagner Summary:   Total Recording Time (hours, min): 7 hours 35 minutes      Total Wagner Time (hours, min): 6 hours 24 minutes               Percent  REM (%):      30%                                  Respiratory Indices by AASM :   Calculated pAHI (per hour): 16/h                           REM pAHI:     20.4/h                                            NREM pAHI:    14.2/h                          Supine AHI:    AHI was 16.4/h in supine Wagner the patient was 310 minutes on her back.  There were only 62 minutes on left sided Wagner recorded here the AHI was also 17.3/h.  Snoring level reached a volume of 43 dB,  snoring was present for ca. 50% of the total Wagner time.                                               Oxygen Saturation Statistics:   Oxygen Saturation (%) Mean:     93%          O2 Saturation Range (%):   Between the nadir at 83 and the maximal saturation of 98%                                    O2 Saturation (minutes) <89%:       0.1-minute  Pulse Rate Statistics:   Pulse Mean (bpm):               51 bpm Pulse Range:      Between 40 and 82 bpm           IMPRESSION:  This HST confirms the presence of mild to moderate Wagner apnea of strictly obstructive origin.  No central apneas were recorded, there was an accentuation in REM Wagner seen.  This was not a REM dependent Wagner apnea.  There was no associated hypoxia noted.  Interestingly , some of the loudest snoring was seen while the patient slept on her left side. Her current AHI is exactly at the border point between mild and moderate Wagner apnea.   RECOMMENDATION: This patient has started with nasal airflow, crossbite, chronic sinusitis postnasal drip.  I think her snoring would be better treated by addressing nasal patency and allergies.  If the patient is truly CPAP fatigued I would consider a Wagner dentistry intervention could be of benefit.  If she is reluctantly willing to stay on CPAP I would still want her to use CPAP but with the treatment allowing a better nasal airway patency.  I would not change any of the settings.     INTERPRETING PHYSICIAN:   Melvyn Novas, MD   [facility] and [facility] Wagner Board certified by The [employer] Wagner Medicine and Diplomate of the [employer] Wagner Medicine. Board certified In Neurology through the ABPN, Fellow of the [employer].

## 2023-04-04 ENCOUNTER — Encounter: Payer: Self-pay | Admitting: Internal Medicine

## 2023-04-04 ENCOUNTER — Other Ambulatory Visit: Payer: Self-pay | Admitting: Internal Medicine

## 2023-04-04 DIAGNOSIS — H532 Diplopia: Secondary | ICD-10-CM

## 2023-04-05 ENCOUNTER — Emergency Department (HOSPITAL_COMMUNITY): Payer: 59

## 2023-04-05 ENCOUNTER — Emergency Department (HOSPITAL_BASED_OUTPATIENT_CLINIC_OR_DEPARTMENT_OTHER): Payer: 59

## 2023-04-05 ENCOUNTER — Other Ambulatory Visit: Payer: Self-pay

## 2023-04-05 ENCOUNTER — Encounter (HOSPITAL_COMMUNITY): Payer: Self-pay

## 2023-04-05 ENCOUNTER — Emergency Department (HOSPITAL_COMMUNITY)
Admission: EM | Admit: 2023-04-05 | Discharge: 2023-04-05 | Disposition: A | Discharge status: Home / Self Care | Payer: 59 | Attending: Emergency Medicine | Admitting: Emergency Medicine

## 2023-04-05 DIAGNOSIS — H53129 Transient visual loss, unspecified eye: Secondary | ICD-10-CM | POA: Diagnosis not present

## 2023-04-05 DIAGNOSIS — H538 Other visual disturbances: Secondary | ICD-10-CM | POA: Insufficient documentation

## 2023-04-05 DIAGNOSIS — R42 Dizziness and giddiness: Secondary | ICD-10-CM | POA: Diagnosis not present

## 2023-04-05 DIAGNOSIS — R519 Headache, unspecified: Secondary | ICD-10-CM | POA: Insufficient documentation

## 2023-04-05 LAB — CBC WITH DIFFERENTIAL/PLATELET
Abs Immature Granulocytes: 0.01 10*3/uL (ref 0.00–0.07)
Basophils Absolute: 0 10*3/uL (ref 0.0–0.1)
Basophils Relative: 1 %
Eosinophils Absolute: 0.1 10*3/uL (ref 0.0–0.5)
Eosinophils Relative: 2 %
HCT: 39.9 % (ref 36.0–46.0)
Hemoglobin: 13.3 g/dL (ref 12.0–15.0)
Immature Granulocytes: 0 %
Lymphocytes Relative: 33 %
Lymphs Abs: 1.4 10*3/uL (ref 0.7–4.0)
MCH: 29.9 pg (ref 26.0–34.0)
MCHC: 33.3 g/dL (ref 30.0–36.0)
MCV: 89.7 fL (ref 80.0–100.0)
Monocytes Absolute: 0.4 10*3/uL (ref 0.1–1.0)
Monocytes Relative: 10 %
Neutro Abs: 2.3 10*3/uL (ref 1.7–7.7)
Neutrophils Relative %: 54 %
Platelets: 301 10*3/uL (ref 150–400)
RBC: 4.45 MIL/uL (ref 3.87–5.11)
RDW: 11.9 % (ref 11.5–15.5)
WBC: 4.3 10*3/uL (ref 4.0–10.5)
nRBC: 0 % (ref 0.0–0.2)

## 2023-04-05 LAB — BASIC METABOLIC PANEL
Anion gap: 10 (ref 5–15)
BUN: 17 mg/dL (ref 6–20)
CO2: 24 mmol/L (ref 22–32)
Calcium: 9.5 mg/dL (ref 8.9–10.3)
Chloride: 105 mmol/L (ref 98–111)
Creatinine, Ser: 0.6 mg/dL (ref 0.44–1.00)
GFR, Estimated: >60 mL/min (ref >60)
Glucose, Bld: 98 mg/dL (ref 70–99)
Potassium: 3.9 mmol/L (ref 3.5–5.1)
Sodium: 139 mmol/L (ref 135–145)

## 2023-04-05 LAB — SEDIMENTATION RATE: Sed Rate: 15 mm/h (ref 0–22)

## 2023-04-05 LAB — C-REACTIVE PROTEIN: CRP: <0.5 mg/dL (ref <1.0)

## 2023-04-05 NOTE — Discharge Instructions (Addendum)
Please follow-up with your PCP.  Return for any new or worsening symptoms.

## 2023-04-05 NOTE — ED Triage Notes (Signed)
Reports repeating headaches, blurred vision and double vision in left eye for 26month.  Has seen ophthalmology and followed up with PCP and had ordered MRI and carotid doppler but imaging center can't see her until feb.

## 2023-04-05 NOTE — Progress Notes (Signed)
VASCULAR LAB    Carotid duplex has been performed.  See CV proc for preliminary results.   Ora Mcnatt, RVT 04/05/2023, 9:13 AM

## 2023-04-05 NOTE — ED Provider Notes (Addendum)
[place] [facility]GENC[facility] DE[facility]MENT AT [facility] Provider Note   CSN: [id number] Arrival date & time: 04/05/23  3086     History  Chief Complaint  Patient presents with   Headache   Blurred Vision    Malik Oakland is a 57 y.o. female with medical history of sleep apnea, TMJ.  Patient presents to ED for evaluation of 3 episodes of blurred vision that lasted less than 15 minutes over the last 1 month.  2 episodes of morning headache that resolved on their own over the last 1 month.  1 episode of dizziness that occurred over the last 1 month and resolved.  She is here requesting MRI of her brain, carotid ultrasound as she reports that her outpatient studies would not be able to be completed until February.  Denies any chest pain, shortness of breath, fevers, current dizziness, current visual issues, current headache.  She denies any one-sided weakness or numbness.  Headache Associated symptoms: dizziness        Home Medications Prior to Admission medications   Medication Sig Start Date End Date Taking? Authorizing Provider  Ascorbic Acid (VITAMIN C GUMMIE PO) Take by mouth.    [provider]  fluorouracil (EFUDEX) 5 % cream Apply topically 2 (two) times daily. 11/26/22   Hyatt, Max T, DPM  guaiFENesin (MUCINEX) 600 MG 12 hr tablet Take 1 tablet (600 mg total) by mouth 2 (two) times daily. 07/03/22   Dohmeier, Porfirio Mylar, MD  LORazepam (ATIVAN) 0.5 MG tablet 1 tablet at bedtime as needed Orally Once a day for 10 days 11/15/22   [provider]  mometasone (NASONEX) 50 MCG/ACT nasal spray Place 2 sprays into the nose daily. Use in PM before CPAP . 07/03/22   Dohmeier, Porfirio Mylar, MD      Allergies    Sulfur dioxide and Pine    Review of Systems   Review of Systems  Eyes:  Positive for visual disturbance.  Neurological:  Positive for dizziness and headaches.  All other systems reviewed and are negative.   Physical Exam Updated Vital Signs BP 114/77 (BP  Location: Left Arm)   Pulse 65   Temp 98.4 F (36.9 C) (Oral)   Resp 16   Ht 5' 8.5" (1.74 m)   Wt 73.5 kg   SpO2 100%   BMI 24.27 kg/m  Physical Exam Vitals and nursing note reviewed.  Constitutional:      General: She is not in acute distress.    Appearance: Normal appearance. She is not ill-appearing, toxic-appearing or diaphoretic.  HENT:     Head: Normocephalic and atraumatic.  Pulmonary:     Effort: No respiratory distress.  Skin:    Coloration: Skin is not jaundiced or pale.  Neurological:     Mental Status: She is alert and oriented to person, place, and time.  Psychiatric:        Behavior: Behavior normal.     ED Results / Procedures / Treatments   Labs (all labs ordered are listed, but only abnormal results are displayed) Labs Reviewed  CBC WITH DIFFERENTIAL/PLATELET  BASIC METABOLIC PANEL  SEDIMENTATION RATE  C-REACTIVE PROTEIN    EKG None  Radiology VAS US CAROTID Result Date: 04/05/2023 Carotid Arterial Duplex Study Patient Name:  JANAKI SISLER [facility]  Date of Exam:   04/05/2023 Medical Rec #: [id number]              Accession #:    [id number] Date of Birth: May 10, 1965  Patient Gender: F Patient Age:   51 years Exam Location:  [facility] Procedure:      VAS US CAROTID Referring Phys: Ernie Avena --------------------------------------------------------------------------------  Indications:       Visual disturbance and Headache, blurry vision/double vision                    of left eye X 1 month, patient unable to get outpatient                    imaging done until February, so patient came to ED for                    testing. Other Factors:     Obstructive sleep apnea. Limitations        Today's exam was limited due to movement, vessel habitus. Comparison Study:  No prior study on file Performing Technologist: Sherren Kerns RVS  Examination Guidelines: A complete evaluation includes B-mode imaging, spectral Doppler, color Doppler, and  power Doppler as needed of all accessible portions of each vessel. Bilateral testing is considered an integral part of a complete examination. Limited examinations for reoccurring indications may be performed as noted.  Right Carotid Findings: +----------+--------+--------+--------+------------------+------------------+           PSV cm/sEDV cm/sStenosisPlaque DescriptionComments           +----------+--------+--------+--------+------------------+------------------+ CCA Prox  108     23                                intimal thickening +----------+--------+--------+--------+------------------+------------------+ CCA Distal97      23                                intimal thickening +----------+--------+--------+--------+------------------+------------------+ ICA Prox  153     51      40-59%  homogeneous       tortuous           +----------+--------+--------+--------+------------------+------------------+ ICA Mid   109     37                                tortuous           +----------+--------+--------+--------+------------------+------------------+ ICA Distal68      21                                tortuous           +----------+--------+--------+--------+------------------+------------------+ ECA       87      11                                                   +----------+--------+--------+--------+------------------+------------------+ +----------+--------+-------+--------+-------------------+           PSV cm/sEDV cmsDescribeArm Pressure (mmHG) +----------+--------+-------+--------+-------------------+ Subclavian101                                        +----------+--------+-------+--------+-------------------+ +---------+--------+--+--------+--+ VertebralPSV cm/s71EDV cm/s23 +---------+--------+--+--------+--+  Left Carotid Findings: +----------+--------+--------+--------+------------------+------------------+  PSV cm/sEDV  cm/sStenosisPlaque DescriptionComments           +----------+--------+--------+--------+------------------+------------------+ CCA Prox  89      23                                intimal thickening +----------+--------+--------+--------+------------------+------------------+ CCA Distal103     31                                intimal thickening +----------+--------+--------+--------+------------------+------------------+ ICA Prox  80      33      1-39%   homogeneous                          +----------+--------+--------+--------+------------------+------------------+ ICA Mid   78      35                                                   +----------+--------+--------+--------+------------------+------------------+ ICA Distal98      41                                                   +----------+--------+--------+--------+------------------+------------------+ ECA       93      17                                                   +----------+--------+--------+--------+------------------+------------------+ +----------+--------+--------+--------+-------------------+           PSV cm/sEDV cm/sDescribeArm Pressure (mmHG) +----------+--------+--------+--------+-------------------+ [id number]                                         +----------+--------+--------+--------+-------------------+ +---------+--------+--+--------+--+ VertebralPSV cm/s60EDV cm/s23 +---------+--------+--+--------+--+   Summary: Right Carotid: Velocities in the right ICA are consistent with a 40-59%                stenosis. Left Carotid: Velocities in the left ICA are consistent with a 1-39% stenosis. Vertebrals:  Bilateral vertebral arteries demonstrate antegrade flow. Subclavians: Normal flow hemodynamics were seen in bilateral subclavian              arteries. *See table(s) above for measurements and observations.     Preliminary    CT HEAD WO CONTRAST ( ) Result Date:  04/05/2023 CLINICAL DATA:  57 year old female with headache, blurred vision, double vision in the left eye. MRI and carotid evaluation requested by ophthalmology. EXAM: CT HEAD WITHOUT CONTRAST TECHNIQUE: Contiguous axial images were obtained from the base of the skull through the vertex without intravenous contrast. RADIATION DOSE REDUCTION: This exam was performed according to the departmental dose-optimization program which includes automated exposure control, adjustment of the mA and/or kV according to patient size and/or use of iterative reconstruction technique. COMPARISON:  Brain MRI 0915 hours today. FINDINGS: Brain: Normal cerebral volume. No midline shift, ventriculomegaly, mass effect, evidence of mass lesion, intracranial hemorrhage or evidence  of cortically based acute infarction. Gray-white matter differentiation is within normal limits throughout the brain. Vascular: No suspicious intracranial vascular hyperdensity. Faint Calcified atherosclerosis at the skull base. Distal left vertebral artery appears dominant. Skull: Intact, negative. Sinuses/Orbits: Visualized paranasal sinuses and mastoids are clear. Other: Visualized orbits and scalp soft tissues are within normal limits. IMPRESSION: Normal for age noncontrast Head CT. Electronically Signed   By: Odessa Fleming M.D.   On: 04/05/2023 10:20   MR BRAIN WO CONTRAST Result Date: 04/05/2023 CLINICAL DATA:  57 year old female with headache, blurred vision, double vision in the left eye. MRI and carotid evaluation requested by ophthalmology. EXAM: MRI HEAD WITHOUT CONTRAST TECHNIQUE: Multiplanar, multiecho pulse sequences of the brain and surrounding structures were obtained without intravenous contrast. COMPARISON:  None Available. FINDINGS: Brain: Normal cerebral volume for age. No restricted diffusion to suggest acute infarction. No midline shift, mass effect, evidence of mass lesion, ventriculomegaly, extra-axial collection or acute intracranial  hemorrhage. Cervicomedullary junction and pituitary are within normal limits. Mild for age scattered and small nonspecific cerebral white matter T2 and FLAIR hyperintensity, slightly greater in the right hemisphere (series 11, image 32). No cortical encephalomalacia. No chronic cerebral blood products on SWI. Deep gray nuclei, brainstem, and cerebellum appear normal. Vascular: Major intracranial vascular flow voids are preserved. Skull and upper cervical spine: Negative. Visualized bone marrow signal is within normal limits. Sinuses/Orbits: Normal suprasellar cistern. Normal optic chiasm. Noncontrast cavernous sinuses unremarkable. Orbits soft tissues appears symmetric, within normal limits. Paranasal sinuses and mastoids are clear. Other: Visible internal auditory structures appear normal. Negative visible scalp and face. IMPRESSION: No acute intracranial and largely normal for age noncontrast MRI appearance of the brain; Minimal to mild cerebral white matter signal changes are nonspecific. Electronically Signed   By: Odessa Fleming M.D.   On: 04/05/2023 09:46    Procedures Procedures   Medications Ordered in ED Medications - No data to display  ED Course/ Medical Decision Making/ A&P  Medical Decision Making Amount and/or Complexity of Data Reviewed Labs: ordered. Radiology: ordered.   57 year old female presents to ED requesting MRI, ultrasound of carotid due to not being able to have imaging studies or carotid ultrasound completed until February.  The patient had these imaging studies and ultrasound studies ordered by her PCP, she was set to have them done in February.  She arrives to the ED requesting that they are done today.  Patient had lab work and imaging ordered in triage by another provider.  Metabolic panel unremarkable.  CBC unremarkable.  Sedimentation rate unremarkable.  CRP unremarkable.  MRI brain unremarkable.  CT head unremarkable.  Carotid ultrasound shows less than 70% stenosis of  bilateral carotids.  At this time patient discharged and advised to follow-up with her PCP.  Advised to return with any new or worsening symptoms.  Final Clinical Impression(s) / ED Diagnoses Final diagnoses:  Nonintractable headache, unspecified chronicity pattern, unspecified headache type  Blurred vision  Dizziness    Rx / DC Orders ED Discharge Orders     None           Melene Plan, DO 04/05/23 1130        Al Decant, PA-C 04/05/23 1138    Melene Plan, DO 04/05/23 1225

## 2023-04-05 NOTE — ED Provider Triage Note (Signed)
Emergency Medicine Provider Triage Evaluation Note  Katelyn Wagner , a 57 y.o. female  was evaluated in triage.  Pt complains of blurred vision and double vision.  The patient has had symptoms of intermittent blurred vision in the middle of the left eye, no full vision loss, also intermittent possibly 4 total episodes of double vision over the last month or 2.  She was seen by her ophthalmologist and reportedly had a normal eye exam but was told that she needed outpatient carotid Dopplers and an MRI.  The patient was unable to get those test done in a timely manner outpatient and presents to the emergency department for testing.  She endorses intermittent headaches, no fevers or chills.  Review of Systems  Positive: Blurred vision, double vision, headache Negative: Facial droop, focal numbness or weakness, chest pain, shortness of breath  Physical Exam  BP (!) 103/58 (BP Location: Right Arm)   Pulse 62   Temp 97.6 F (36.4 C) (Oral)   Resp 17   Ht 5' 8.5" (1.74 m)   Wt 73.5 kg   SpO2 100%   BMI 24.27 kg/m  Gen:   Awake, no distress   Resp:  Normal effort  MSK:   Moves extremities without difficulty  Neuro:  Cranial nerves II through XII intact, 5 out of 5 strength in the bilateral upper and lower extremities with intact sensation to light touch  Medical Decision Making  Medically screening exam initiated at 8:22 AM.  Appropriate orders placed.  Katelyn Wagner was informed that the remainder of the evaluation will be completed by another provider, this initial triage assessment does not replace that evaluation, and the importance of remaining in the ED until their evaluation is complete.  Plan for CT of the head, MRI of the brain, patient requesting carotid Dopplers rather than CT angiogram to further evaluate due to concern regarding radiation.   Ernie Avena, MD 04/05/23 [phone]

## 2023-04-07 ENCOUNTER — Encounter: Payer: Self-pay | Admitting: Internal Medicine

## 2023-04-17 ENCOUNTER — Ambulatory Visit: Payer: 59 | Admitting: Podiatry

## 2023-04-22 ENCOUNTER — Other Ambulatory Visit: Payer: 59

## 2023-05-01 ENCOUNTER — Ambulatory Visit: Payer: 59 | Admitting: Podiatry

## 2023-05-20 ENCOUNTER — Encounter: Payer: Self-pay | Admitting: Neurology

## 2023-05-20 ENCOUNTER — Ambulatory Visit (INDEPENDENT_AMBULATORY_CARE_PROVIDER_SITE_OTHER): Payer: 59 | Admitting: Neurology

## 2023-05-20 VITALS — BP 105/60 | HR 55 | Ht 69.0 in | Wt 157.0 lb

## 2023-05-20 DIAGNOSIS — J3089 Other allergic rhinitis: Secondary | ICD-10-CM | POA: Diagnosis not present

## 2023-05-20 DIAGNOSIS — H532 Diplopia: Secondary | ICD-10-CM | POA: Diagnosis not present

## 2023-05-20 NOTE — Progress Notes (Signed)
Provider:  Melvyn Novas, MD  Primary Care Physician:  Rodrigo Ran, MD [address] [place] [address]     Referring Provider:  Rodrigo Ran, Md [address] [place],  [place] [address]          Chief Complaint according to patient   Patient presents with:     New problem of abnormal brain MRI in established sleep Patient (Initial Visit).58 year old female with headache, blurred vision, double vision in the left eye. MRI and carotid evaluation requested by ophthalmology.           HISTORY OF PRESENT ILLNESS:  Katelyn Wagner is a 58 y.o. female patient who is here for revisit 05/20/2023 for a recent development of transient diplopia and left eye center vision blurring.  MRI and carotid doppler studies followed ,  carotid study was normal, CAD negative but she was told the brain MRI was different.  We are here reviewing.   IMPRESSION: No acute intracranial and largely normal for age noncontrast MRI appearance of the brain; Minimal to mild cerebral white matter signal changes are nonspecific.     05/14/2023 12:56 PM EST   Calcified atherosclerotic disease is evident within the coronary arterial distribution. The patient's total coronary artery calcium score is 8.3, which places the patient in the 75 percentile for individuals of matched age, gender and race/ethnicity.  Chief concern according to patient :  My mother died with dementia - over many years and I was a caretaker.     07-03-2022: chronic sinus congestion but had a nasal congestion and postnasal drip in December , took sudafed the night of the HST and felt it made no difference and she was surprised to see her apnea had increased - in spite of weight loss. Thee was a high proportion of REM sleep and 66% of REM sleep was supine , correlating with higher AHI.  We discussed repeating the HST and to treat her sinus congestion- giving her a pressure sensation in the forehead and the postnasal  drip.   "This home sleep test demonstrates moderate obstructive sleep apnea with a total AHI of 24.3/hour and O2 nadir of 84%. Mild to moderate snoring was detected. Ongoing treatment with a positive airway pressure (PAP) device is recommended. The patient has a current AutoPap machine and I would recommend that she continue with treatment for now. If she has significant tolerance issues, one could consider bringing her in for a designated CPAP titration study. "       11/ 16/ 2023.    [name] is a 58 year- old- Caucasian female patient, seen on 02/21/2022 ,  She has been very resistant to CPAP use, just hates it. Here after a weight loss of 25 pounds.  6 months on " [email or web address]" and she has a Midwife, with good success.  We are discussing a repeat HST to see if a PAP therapy is still needed. Epworth sleepiness score is 6 and FSS endorsed at 23/ 63.        RV from Dr Waynard Edwards.  08-21-2021:  She is using CPAP with resistance, she takes sometimes off in the night. She is sweating under the mask.     Review of Systems: Out of a complete 14 system review, the patient complains of only the following symptoms, and all other reviewed systems are negative.:  Fatigue, sleepiness , snoring, fragmented sleep,    Post nasal drip. Fear of  dementia. Anxiety about health.   Social History   Socioeconomic History   Marital status: Married    Spouse name: Not on file   Number of children: 2   Years of education: Not on file   Highest education level: Not on file  Occupational History   Not on file  Tobacco Use   Smoking status: Never   Smokeless tobacco: Never  Vaping Use   Vaping status: Never Used  Substance and Sexual Activity   Alcohol use: Not Currently   Drug use: No   Sexual activity: Not on file  Other Topics Concern   Not on file  Social History Narrative   Right handed   Wears glasses    Social Drivers of Health   Financial Resource Strain: Not  on file  Food Insecurity: Not on file  Transportation Needs: Not on file  Physical Activity: Not on file  Stress: Not on file  Social Connections: Not on file    Family History  Problem Relation Age of Onset   Dementia Mother    Colon cancer Father 20   Colon polyps Brother    Esophageal cancer Neg Hx    Stomach cancer Neg Hx    Rectal cancer Neg Hx     Past Medical History:  Diagnosis Date   Allergy    sulfer dioxide   Carotid artery stenosis    Hypercholesteremia    Pain in Achilles tendon    bilateral   Sleep apnea    does not use CPAP   TMJ (dislocation of temporomandibular joint)     Past Surgical History:  Procedure Laterality Date   COLONOSCOPY  03/04/2016   Dr.Jacobs   DILATION AND CURETTAGE OF UTERUS  2010 &2005   KNEE ARTHROSCOPY  1990   left   RETINAL LASER PROCEDURE     WISDOM TOOTH EXTRACTION       Current Outpatient Medications on File Prior to Visit  Medication Sig Dispense Refill   rosuvastatin (CRESTOR) 10 MG tablet Take 10 mg by mouth daily.     sertraline (ZOLOFT) 50 MG tablet Take 1 tablet by mouth daily.     Ascorbic Acid (VITAMIN C GUMMIE PO) Take by mouth. (Patient not taking: Reported on 05/20/2023)     fluorouracil (EFUDEX) 5 % cream Apply topically 2 (two) times daily. (Patient not taking: Reported on 05/20/2023) 40 g 1   guaiFENesin (MUCINEX) 600 MG 12 hr tablet Take 1 tablet (600 mg total) by mouth 2 (two) times daily. (Patient not taking: Reported on 05/20/2023)     LORazepam (ATIVAN) 0.5 MG tablet 1 tablet at bedtime as needed Orally Once a day for 10 days (Patient not taking: Reported on 05/20/2023)     mometasone (NASONEX) 50 MCG/ACT nasal spray Place 2 sprays into the nose daily. Use in PM before CPAP . (Patient not taking: Reported on 05/20/2023) 1 each 12   No current facility-administered medications on file prior to visit.    Allergies  Allergen Reactions   Sulfur Dioxide Other (See Comments)    "throat tighten"   Pine Itching      DIAGNOSTIC DATA (LABS, IMAGING, TESTING) - I reviewed patient records, labs, notes, testing and imaging myself where available.  Lab Results  Component Value Date   WBC 4.3 04/05/2023   HGB 13.3 04/05/2023   HCT 39.9 04/05/2023   MCV 89.7 04/05/2023   PLT 301 04/05/2023      Component Value Date/Time   NA 139 04/05/2023 0741  K 3.9 04/05/2023 0741   CL 105 04/05/2023 0741   CO2 24 04/05/2023 0741   GLUCOSE 98 04/05/2023 0741   BUN 17 04/05/2023 0741   CREATININE 0.60 04/05/2023 0741   CALCIUM 9.5 04/05/2023 0741   GFRNONAA >60 04/05/2023 0741   No results found for: "CHOL", "HDL", "LDLCALC", "LDLDIRECT", "TRIG", "CHOLHDL" No results found for: "HGBA1C" No results found for: "VITAMINB12" No results found for: "TSH"  PHYSICAL EXAM:  Today's Vitals   05/20/23 0929  BP: 105/60  Pulse: (!) 55  Weight: 157 lb (71.2 kg)  Height: 5\' 9"  (1.753 m)   Body mass index is 23.18 kg/m.   Wt Readings from Last 3 Encounters:  05/20/23 157 lb (71.2 kg)  04/05/23 162 lb (73.5 kg)  10/01/22 162 lb (73.5 kg)     Ht Readings from Last 3 Encounters:  05/20/23 5\' 9"  (1.753 m)  04/05/23 5' 8.5" (1.74 m)  10/01/22 5' 8.5" (1.74 m)      General: The patient is awake, alert and appears not in acute distress. The patient is awake, alert and appears not in acute distress. The patient is well groomed. Head: Normocephalic, atraumatic. Neck is supple. Mallampati 2 ,  neck circumference:15.5  inches .  Nasal airflow barely patent. Crossbite noted. Chronic sinusitis. Post nasal drip-  Dental status:  Intact Cardiovascular:  Regular rate and cardiac rhythm by pulse,  without distended neck veins. Respiratory: Lungs are clear to auscultation.  Skin:  Without evidence of ankle edema, or rash. Trunk: The patient's posture is erect.   Neurologic exam : The patient is awake and alert, oriented to place and time.   Memory subjective described as intact.  Attention span & concentration  ability appears normal.  Speech is fluent,  without  dysarthria, dysphonia or aphasia.  Mood and affect are appropriate. Stressed - custody.    Cranial nerves: no loss of smell or taste reported  Pupils are equal and briskly reactive to light.  Funduscopic exam deferred.  Extraocular movements in vertical and horizontal planes were intact and without nystagmus. No Diplopia. Visual fields by finger perimetry are intact. Hearing was reduced. Facial sensation intact to fine touch.  Facial motor strength is symmetric and tongue and uvula move midline.  Neck ROM : rotation, tilt and flexion extension were normal for age and shoulder shrug was symmetrical.    Motor exam:  Symmetric bulk, tone and ROM.   Normal tone without cog- wheeling, symmetric grip strength .   Sensory:  Vibration was absent in the big toes, present at the ankles.  Proprioception tested in the upper extremities was normal.   Coordination: Rapid alternating movements in the fingers/hands were of normal speed.  The Finger-to-nose maneuver was intact without evidence of ataxia, dysmetria or tremor.   Gait and station: Patient could rise unassisted from a seated position, walked without assistive device.  Stance is of normal width/ base and the patient turned with 3 steps.  Toe and heel walk were deferred.  Deep tendon reflexes: deferred              ASSESSMENT AND PLAN 58 y.o. year old female  here with:    1)  normal brain MRI for age,    2) no significant stenosis  of the carotid.   3) MIND diet,  discussed statins and return should the memory subjectively decline.   4) rec. ENT consult for nasal drip.     I plan to follow up either personally or through  our NP within 12 months.   I would like to thank Rodrigo Ran, MD and Rodrigo Ran, Md [address] [place],  [place] [address] for allowing me to meet with and to take care of this pleasant patient.    After spending a total time of  35  minutes  face to face and additional time for physical and neurologic examination, review of laboratory studies,  personal review of imaging studies, reports and results of other testing and review of referral information / records as far as provided in visit,   Electronically signed by: Melvyn Novas, MD 05/20/2023 9:50 AM  [facility] Board certified by The [employer] of Sleep Medicine and Diplomate of the [employer] of Sleep Medicine. Board certified In Neurology through the ABPN, Fellow of the [employer] of [employer].

## 2023-05-20 NOTE — Patient Instructions (Signed)
or placebo) before they develop symptoms, and the development of beta-amyloid plaques is being monitored by brain scans and other tests.  Another clinical trial, known as the A4 trial  (Anti-Amyloid Treatment in Asymptomatic Alzheimer's), is testing whether antibodies to beta-amyloid can reduce the risk of Alzheimer's disease in older people (ages 74 to 2) at high risk for the disease. The A4 trial is being conducted by the Alzheimer's Disease Cooperative Study.  Though research is still evolving, evidence is strong that people can reduce their risk by making key lifestyle changes, including participating in regular activity and maintaining good heart health. Based on this research, the Alzheimer's Association offers 10 Ways to Love Your Brain -- a collection of tips that can reduce the risk of cognitive decline.  Heart-head connection  New research shows there are things we can do to reduce the risk of mild cognitive impairment and dementia.  Several conditions known to increase the risk of cardiovascular disease -- such as high blood pressure, diabetes and high cholesterol -- also increase the risk of developing Alzheimer's. Some autopsy studies show that as many as 80 percent of individuals with Alzheimer's disease also have cardiovascular disease.  A longstanding question is why some people develop hallmark Alzheimer's plaques and tangles but do not develop the symptoms of Alzheimer's. Vascular disease may help researchers eventually find an answer. Some autopsy studies suggest that plaques and tangles may be present in the brain without causing symptoms of cognitive decline unless the brain also shows evidence of vascular disease. More research is needed to better understand the link between vascular health and Alzheimer's.  Physical exercise and diet Regular physical exercise may be a beneficial strategy to lower the risk of Alzheimer's and vascular dementia. Exercise may directly benefit brain cells by increasing blood and oxygen flow in the brain. Because of its known cardiovascular benefits, a medically approved exercise program is a valuable part of any overall wellness  plan.  Current evidence suggests that heart-healthy eating may also help protect the brain. Heart-healthy eating includes limiting the intake of sugar and saturated fats and making sure to eat plenty of fruits, vegetables, and whole grains. No one diet is best. Two diets that have been studied and may be beneficial are the DASH (Dietary Approaches to Stop Hypertension) diet and the Mediterranean diet. The DASH diet emphasizes vegetables, fruits and fat-free or low-fat dairy products; includes whole grains, fish, poultry, beans, seeds, nuts and vegetable oils; and limits sodium, sweets, sugary beverages and red meats. A Mediterranean diet includes relatively little red meat and emphasizes whole grains, fruits and vegetables, fish and shellfish, and nuts, olive oil and other healthy fats.  Social connections and intellectual activity A number of studies indicate that maintaining strong social connections and keeping mentally active as we age might lower the risk of cognitive decline and Alzheimer's. Experts are not certain about the reason for this association. It may be due to direct mechanisms through which social and mental stimulation strengthen connections between nerve cells in the brain.  Head trauma There appears to be a strong link between future risk of Alzheimer's and serious head trauma, especially when injury involves loss of consciousness. You can help reduce your risk of Alzheimer's by protecting your head.  Wear a seat belt  Use a helmet when participating in sports  "Fall-proof" your home   What you can do now While research is not yet conclusive, certain lifestyle choices, such as physical activity and diet, may help support brain  health and prevent Alzheimer's. Many of these lifestyle changes have been shown to lower the risk of other diseases, like heart disease and diabetes, which have been linked to Alzheimer's. With few drawbacks and plenty of known benefits, healthy lifestyle  choices can improve your health and possibly protect your brain.  Learn more about brain health. You can help increase our knowledge by considering participation in a clinical study. Our free clinical trial matching services, TrialMatch, can help you find clinical trials in your area that are seeking volunteers.  Understanding prevention research Here are some things to keep in mind about the research underlying much of our current knowledge about possible prevention:  Insights about potentially modifiable risk factors apply to large population groups, not to individuals. Studies can show that factor X is associated with outcome Y, but cannot guarantee that any specific person will have that outcome. As a result, you can "do everything right" and still have a serious health problem or "do everything wrong" and live to be 100.  Much of our current evidence comes from large epidemiological studies such as the [place] Aging Study, the Nurses' Health Study, the Adult Changes in Thought Study and the [employer]. These studies explore pre-existing behaviors and use statistical methods to relate those behaviors to health outcomes. This type of study can show an "association" between a factor and an outcome but cannot "prove" cause and effect. This is why we describe evidence based on these studies with such language as "suggests," "may show," "might protect," and "is associated with."  The gold standard for showing cause and effect is a clinical trial in which participants are randomly assigned to a prevention or risk management strategy or a control group. Researchers follow the two groups over time to see if their outcomes differ significantly.  It is unlikely that some prevention or risk management strategies will ever be tested in randomized trials for ethical or practical reasons. One example is exercise. Definitively testing the impact of exercise on Alzheimer's risk would require a huge  trial enrolling thousands of people and following them for many years. The expense and logistics of such a trial would be prohibitive, and it would require some people to go without exercise, a known health benefit.

## 2023-05-20 NOTE — Addendum Note (Signed)
Addended by: Melvyn Novas on: 05/20/2023 10:32 AM   Modules accepted: Orders

## 2023-05-20 NOTE — Progress Notes (Signed)
Katelyn Wagner

## 2023-06-23 ENCOUNTER — Encounter: Payer: 59 | Admitting: Surgery

## 2023-06-27 ENCOUNTER — Other Ambulatory Visit (HOSPITAL_COMMUNITY): Payer: Self-pay | Admitting: Internal Medicine

## 2023-06-27 DIAGNOSIS — A09 Infectious gastroenteritis and colitis, unspecified: Secondary | ICD-10-CM

## 2023-06-27 DIAGNOSIS — H532 Diplopia: Secondary | ICD-10-CM

## 2023-06-27 DIAGNOSIS — R197 Diarrhea, unspecified: Secondary | ICD-10-CM

## 2023-06-28 ENCOUNTER — Ambulatory Visit (HOSPITAL_COMMUNITY)

## 2023-06-28 ENCOUNTER — Encounter (HOSPITAL_COMMUNITY): Payer: Self-pay

## 2023-07-01 ENCOUNTER — Telehealth: Payer: Self-pay | Admitting: Neurology

## 2023-07-01 NOTE — Telephone Encounter (Signed)
 Does pt need to come in for appt 3/26. Pt already has another appt scheduled as well for cpap machine. Please advise.

## 2023-07-01 NOTE — Telephone Encounter (Signed)
 Spoke w/Pt regarding appt 3/26 which is annual F/U for CPAP. Pt stated she has not been wearing CPAP on a regular basis because she is having issues - has been removing mask in her sleep. Pt states she has worn it some and there should be some data but does want to discuss issues she is having. Pt stated she wants to keep 3/26 appt so we will cancel Oct appt.

## 2023-07-02 ENCOUNTER — Encounter: Payer: Self-pay | Admitting: Neurology

## 2023-07-02 ENCOUNTER — Ambulatory Visit (INDEPENDENT_AMBULATORY_CARE_PROVIDER_SITE_OTHER): Payer: 59 | Admitting: Neurology

## 2023-07-02 VITALS — BP 99/66 | HR 67 | Ht 69.0 in | Wt 153.9 lb

## 2023-07-02 DIAGNOSIS — J3089 Other allergic rhinitis: Secondary | ICD-10-CM

## 2023-07-02 DIAGNOSIS — G478 Other sleep disorders: Secondary | ICD-10-CM | POA: Diagnosis not present

## 2023-07-02 DIAGNOSIS — R0982 Postnasal drip: Secondary | ICD-10-CM

## 2023-07-02 DIAGNOSIS — G4733 Obstructive sleep apnea (adult) (pediatric): Secondary | ICD-10-CM

## 2023-07-02 MED ORDER — FEXOFENADINE HCL 60 MG PO TABS: 60.0000 mg | ORAL_TABLET | Freq: Two times a day (BID) | ORAL | 2 refills | Status: AC

## 2023-07-02 NOTE — Progress Notes (Signed)
 Provider:  Melvyn Novas, MD  Primary Care Physician:  Rodrigo Ran, MD [address] [place] [address]     Referring Provider: Rodrigo Ran, Md [address] [place],  [place] [address]          Chief Complaint according to patient   Patient presents with:          Patient states she has trouble keeping  mask on  but she is doing well.       HISTORY OF PRESENT ILLNESS:  Katelyn Wagner is a 58 y.o. female patient who is here for revisit 07/02/2023 for reluctant CPAP use.  Using a nasal cradle has been easier.  Had paused using CPAP for 2-3 weeks for upper airway cold,  went to [place] where she contracted a  febrile viral illness.   .  Chief concern according to patient :  " I need to find a better way for CPAP ft it "     Alternative therapies discussed.  She has a history of TMJ and is reluctant to use a dental device ,buts also not compliant with CPAP.  I will prescribe nasal prongs ( pillows  for her ).  RV in 12 months.        Review of Systems: Out of a complete 14 system review, the patient complains of only the following symptoms, and all other reviewed systems are negative.:   See above   Social History   Socioeconomic History   Marital status: Married    Spouse name: Not on file   Number of children: 2   Years of education: Not on file   Highest education level: Not on file  Occupational History   Not on file  Tobacco Use   Smoking status: Never   Smokeless tobacco: Never  Vaping Use   Vaping status: Never Used  Substance and Sexual Activity   Alcohol use: Not Currently   Drug use: No   Sexual activity: Not on file  Other Topics Concern   Not on file  Social History Narrative   Right handed   Wears glasses    Social Drivers of Health   Financial Resource Strain: Not on file  Food Insecurity: Not on file  Transportation Needs: Not on file  Physical Activity: Not on file  Stress: Not on file  Social  Connections: Not on file    Family History  Problem Relation Age of Onset   Dementia Mother    Colon cancer Father 9   Colon polyps Brother    Esophageal cancer Neg Hx    Stomach cancer Neg Hx    Rectal cancer Neg Hx     Past Medical History:  Diagnosis Date   Allergy    sulfer dioxide   Carotid artery stenosis    Hypercholesteremia    Pain in Achilles tendon    bilateral   Sleep apnea    does not use CPAP   TMJ (dislocation of temporomandibular joint)     Past Surgical History:  Procedure Laterality Date   COLONOSCOPY  03/04/2016   Dr.Jacobs   DILATION AND CURETTAGE OF UTERUS  2010 &2005   KNEE ARTHROSCOPY  1990   left   RETINAL LASER PROCEDURE     WISDOM TOOTH EXTRACTION       Current Outpatient Medications on File Prior to Visit  Medication Sig Dispense Refill   rosuvastatin (CRESTOR) 10 MG tablet Take 20 mg by  mouth daily.     sertraline (ZOLOFT) 50 MG tablet Take 1 tablet by mouth daily.     Ascorbic Acid (VITAMIN C GUMMIE PO) Take by mouth. (Patient not taking: Reported on 07/02/2023)     fluorouracil (EFUDEX) 5 % cream Apply topically 2 (two) times daily. (Patient not taking: Reported on 07/02/2023) 40 g 1   guaiFENesin (MUCINEX) 600 MG 12 hr tablet Take 1 tablet (600 mg total) by mouth 2 (two) times daily. (Patient not taking: Reported on 07/02/2023)     LORazepam (ATIVAN) 0.5 MG tablet 1 tablet at bedtime as needed Orally Once a day for 10 days (Patient not taking: Reported on 07/02/2023)     mometasone (NASONEX) 50 MCG/ACT nasal spray Place 2 sprays into the nose daily. Use in PM before CPAP . (Patient not taking: Reported on 07/02/2023) 1 each 12   No current facility-administered medications on file prior to visit.    Allergies  Allergen Reactions   Sulfur Dioxide Other (See Comments)    "throat tighten"   Pine Itching     DIAGNOSTIC DATA (LABS, IMAGING, TESTING) - I reviewed patient records, labs, notes, testing and imaging myself where  available.  Lab Results  Component Value Date   WBC 4.3 04/05/2023   HGB 13.3 04/05/2023   HCT 39.9 04/05/2023   MCV 89.7 04/05/2023   PLT 301 04/05/2023      Component Value Date/Time   NA 139 04/05/2023 0741   K 3.9 04/05/2023 0741   CL 105 04/05/2023 0741   CO2 24 04/05/2023 0741   GLUCOSE 98 04/05/2023 0741   BUN 17 04/05/2023 0741   CREATININE 0.60 04/05/2023 0741   CALCIUM 9.5 04/05/2023 0741   GFRNONAA >60 04/05/2023 0741   No results found for: "CHOL", "HDL", "LDLCALC", "LDLDIRECT", "TRIG", "CHOLHDL" No results found for: "HGBA1C" No results found for: "VITAMINB12" No results found for: "TSH"  PHYSICAL EXAM:  Today's Vitals   07/02/23 1022  BP: 99/66  Pulse: 67  Weight: 153 lb 14.4 oz (69.8 kg)  Height: 5\' 9"  (1.753 m)   Body mass index is 22.73 kg/m.   Wt Readings from Last 3 Encounters:  07/02/23 153 lb 14.4 oz (69.8 kg)  05/20/23 157 lb (71.2 kg)  04/05/23 162 lb (73.5 kg)     Ht Readings from Last 3 Encounters:  07/02/23 5\' 9"  (1.753 m)  05/20/23 5\' 9"  (1.753 m)  04/05/23 5' 8.5" (1.74 m)      General: The patient is awake, alert and appears not in acute distress. The patient is well groomed. Head: Normocephalic, atraumatic. Neck is supple. Mallampati ,  neck circumference:15.5  inches . Nasal airflow  patent.   Overbite Harlow Asa is  seen.  BMI 22. 7 Cardiovascular:  Regular rate and cardiac rhythm by pulse,  without distended neck veins. Respiratory: Lungs are clear to auscultation.  Skin:  Without evidence of ankle edema, or rash. Trunk: The patient's posture is erect.   NEUROLOGIC EXAM: The patient is awake and alert, oriented to place and time.   Memory subjective described as intact.  Attention span & concentration ability appears normal.  Speech is fluent,  without  dysarthria, dysphonia or aphasia.  Mood and affect are appropriate.   Cranial nerves: no loss of smell or taste reported  Pupils are equal and briskly reactive to  light. Extraocular movements in vertical and horizontal planes were intact and without nystagmus. No Diplopia. Visual fields by finger perimetry are intact. Hearing was impaired.  Facial sensation intact to fine touch.  Facial motor strength is symmetric and tongue and uvula move midline.  Neck ROM : rotation, tilt and flexion extension were normal for age and shoulder shrug was symmetrical.    ASSESSMENT AND PLAN 58 y.o. year old female  here with:    1) mild  to moderate sleep apnea with sporadic CPAP use.   2) low BMI but abdominal  girth- core exercises recommended. Protein dietary increase.  Calcium  for osteo  3)  Allegra daily for seasonal allergies and to prevent sinus HA.       I plan to follow up either personally or through our NP within 12 months.   I would like to thank Rodrigo Ran, MD and Rodrigo Ran, Md [address] [place],  [place] [address] for allowing me to meet with and to take care of this pleasant patient.    After spending a total time of  20  minutes face to face and additional time for physical and neurologic examination, review of laboratory studies,  personal review of imaging studies, reports and results of other testing and review of referral information / records as far as provided in visit,   Electronically signed by: Melvyn Novas, MD 07/02/2023 11:13 AM  [facility] Board certified by The [employer] of Sleep Medicine and Diplomate of the [employer] of Sleep Medicine. Board certified In Neurology through the ABPN, Fellow of the [employer] of Neurology.

## 2023-07-17 ENCOUNTER — Institutional Professional Consult (permissible substitution) (INDEPENDENT_AMBULATORY_CARE_PROVIDER_SITE_OTHER): Payer: 59 | Admitting: Otolaryngology

## 2023-07-17 ENCOUNTER — Ambulatory Visit (INDEPENDENT_AMBULATORY_CARE_PROVIDER_SITE_OTHER): Payer: 59 | Admitting: Otolaryngology

## 2023-07-17 ENCOUNTER — Encounter (INDEPENDENT_AMBULATORY_CARE_PROVIDER_SITE_OTHER): Payer: Self-pay | Admitting: Otolaryngology

## 2023-07-17 VITALS — BP 105/67 | HR 74 | Ht 68.5 in | Wt 151.0 lb

## 2023-07-17 DIAGNOSIS — G4733 Obstructive sleep apnea (adult) (pediatric): Secondary | ICD-10-CM

## 2023-07-17 DIAGNOSIS — J3089 Other allergic rhinitis: Secondary | ICD-10-CM

## 2023-07-17 DIAGNOSIS — Z91198 Patient's noncompliance with other medical treatment and regimen for other reason: Secondary | ICD-10-CM | POA: Diagnosis not present

## 2023-07-17 DIAGNOSIS — Z9109 Other allergy status, other than to drugs and biological substances: Secondary | ICD-10-CM

## 2023-07-17 DIAGNOSIS — J342 Deviated nasal septum: Secondary | ICD-10-CM | POA: Diagnosis not present

## 2023-07-17 DIAGNOSIS — H9319 Tinnitus, unspecified ear: Secondary | ICD-10-CM | POA: Diagnosis not present

## 2023-07-17 DIAGNOSIS — R0981 Nasal congestion: Secondary | ICD-10-CM

## 2023-07-17 DIAGNOSIS — M26623 Arthralgia of bilateral temporomandibular joint: Secondary | ICD-10-CM

## 2023-07-17 DIAGNOSIS — R0982 Postnasal drip: Secondary | ICD-10-CM

## 2023-07-17 DIAGNOSIS — J343 Hypertrophy of nasal turbinates: Secondary | ICD-10-CM

## 2023-07-17 MED ORDER — FLUTICASONE PROPIONATE 50 MCG/ACT NA SUSP: 2.0000 | Freq: Two times a day (BID) | NASAL | 6 refills | Status: AC

## 2023-07-17 NOTE — Progress Notes (Signed)
 ENT CONSULT:  Reason for Consult: chronic nasal congestion post-nasal drainage   HPI: Discussed the use of AI scribe software for clinical note transcription with the patient, who gave verbal consent to proceed.  History of Present Illness Katelyn Wagner is a 58 year old female with chronic nasal congestion and history of sleep apnea who presents for initial evaluation.   She experiences nasal drainage and has a long-standing history of chronic nasal congestion. No nasal blockage, and she is able to breathe through her nose. She has not undergone sinus surgery in the past. An MRI from December 2024 showed clear sinuses but revealed a septal deviation.  She was diagnosed with sleep apnea approximately a year ago and has been on CPAP therapy, which she tolerates for about three to four hours a night. She finds the CPAP mask uncomfortable due to the 'plastic on her face.' She reports snoring 'really bad' as a symptom leading to her sleep apnea diagnosis. Despite losing 35 pounds through a service called [employer], her sleep apnea has not improved.  She has TMJ sy, which makes her hesitant to use oral appliances for sleep apnea. Additionally, she experiences dryness in her ears and has tinnitus, for which she has hearing aids but did not wear them to the appointment.  She has been tested for allergies in the past with no significant findings, although she lacks confidence in the allergist. She currently takes Allegra but does not use nasal sprays. She has not tried [employer] despite purchasing it.  Her BMI was recorded as 27 a year ago, and she has since lost 35 pounds. She follows a regimen of increased protein, vegetables, water, and physical activity.  Records Reviewed:  Office visit with Dr Vickey Huger 07/02/23 HISTORY OF PRESENT ILLNESS:  Katelyn Wagner is a 58 y.o. female patient who is here for revisit 07/02/2023 for reluctant CPAP use.  Using a nasal cradle has been easier.  Had  paused using CPAP for 2-3 weeks for upper airway cold,  went to [place] where she contracted a  febrile viral illness.   .  Chief concern according to patient :  " I need to find a better way for CPAP ft it "    Alternative therapies discussed.  She has a history of TMJ and is reluctant to use a dental device ,buts also not compliant with CPAP.  I will prescribe nasal prongs ( pillows  for her ).  RV in 12 months.     1) mild  to moderate sleep apnea with sporadic CPAP use.    2) low BMI but abdominal  girth- core exercises recommended. Protein dietary increase.  Calcium  for osteo   3)  Allegra daily for seasonal allergies and to prevent sinus HA.    Past Medical History:  Diagnosis Date   Allergy    sulfer dioxide   Carotid artery stenosis    Hypercholesteremia    Pain in Achilles tendon    bilateral   Sleep apnea    does not use CPAP   TMJ (dislocation of temporomandibular joint)     Past Surgical History:  Procedure Laterality Date   COLONOSCOPY  03/04/2016   Dr.Jacobs   DILATION AND CURETTAGE OF UTERUS  2010 &2005   KNEE ARTHROSCOPY  1990   left   RETINAL LASER PROCEDURE     WISDOM TOOTH EXTRACTION      Family History  Problem Relation Age of Onset   Dementia Mother  Colon cancer Father 1   Colon polyps Brother    Esophageal cancer Neg Hx    Stomach cancer Neg Hx    Rectal cancer Neg Hx     Social History:  reports that she has never smoked. She has never used smokeless tobacco. She reports that she does not currently use alcohol. She reports that she does not use drugs.  Allergies:  Allergies  Allergen Reactions   Sulfur Dioxide Other (See Comments)    "throat tighten"   Pine Itching    Medications: I have reviewed the patient's current medications.  The PMH, PSH, Medications, Allergies, and SH were reviewed and updated.  ROS: Constitutional: Negative for fever, weight loss and weight gain. Cardiovascular: Negative for chest pain and  dyspnea on exertion. Respiratory: Is not experiencing shortness of breath at rest. Gastrointestinal: Negative for nausea and vomiting. Neurological: Negative for headaches. Psychiatric: The patient is not nervous/anxious  Blood pressure 105/67, pulse 74, height 5' 8.5" (1.74 m), weight 151 lb (68.5 kg), SpO2 98%. Body mass index is 22.63 kg/m.  PHYSICAL EXAM:  Exam: General: Well-developed, well-nourished Respiratory Respiratory effort: Equal inspiration and expiration without stridor Cardiovascular Peripheral Vascular: Warm extremities with equal color/perfusion Eyes: No nystagmus with equal extraocular motion bilaterally Neuro/Psych/Balance: Patient oriented to person, place, and time; Appropriate mood and affect; Gait is intact with no imbalance; Cranial nerves I-XII are intact Head and Face Inspection: Normocephalic and atraumatic without mass or lesion Palpation: Facial skeleton intact without bony stepoffs Salivary Glands: No mass or tenderness Facial Strength: Facial motility symmetric and full bilaterally ENT Pinna: External ear intact and fully developed External canal: Canal is patent with intact skin Tympanic Membrane: Clear and mobile External Nose: No scar or anatomic deformity Internal Nose: Septum is deviated to the left. No polyp, or purulence. Mucosal edema and erythema present.  Bilateral inferior turbinate hypertrophy.  Lips, Teeth, and gums: Mucosa and teeth intact and viable TMJ: No pain to palpation with full mobility Oral cavity/oropharynx: No erythema or exudate, no lesions present Nasopharynx: No mass or lesion with intact mucosa Hypopharynx: Intact mucosa without pooling of secretions Larynx Glottic: Full true vocal cord mobility without lesion or mass Supraglottic: Normal appearing epiglottis and AE folds Interarytenoid Space: Moderate pachydermia&edema Subglottic Space: Patent without lesion or edema Neck Neck and Trachea: Midline trachea without  mass or lesion Thyroid: No mass or nodularity Lymphatics: No lymphadenopathy  Procedure:   PROCEDURE NOTE: nasal endoscopy  Preoperative diagnosis: chronic sinusitis symptoms  Postoperative diagnosis: same  Procedure: Diagnostic nasal endoscopy ([address])  Surgeon: Ashok Croon, M.D.  Anesthesia: Topical lidocaine and Afrin  H&P REVIEW: The patient's history and physical were reviewed today prior to procedure. All medications were reviewed and updated as well. Complications: None Condition is stable throughout exam Indications and consent: The patient presents with symptoms of chronic sinusitis not responding to previous therapies. All the risks, benefits, and potential complications were reviewed with the patient preoperatively and informed consent was obtained. The time out was completed with confirmation of the correct procedure.   Procedure: The patient was seated upright in the clinic. Topical lidocaine and Afrin were applied to the nasal cavity. After adequate anesthesia had occurred, the rigid nasal endoscope was passed into the nasal cavity. The nasal mucosa, turbinates, septum, and sinus drainage pathways were visualized bilaterally. This revealed no purulence or significant secretions that might be cultured. There were no polyps or sites of significant inflammation. The mucosa was intact and there was no crusting present. The scope  was then slowly withdrawn and the patient tolerated the procedure well. There were no complications or blood loss.   Studies Reviewed: MRI brain 04/05/23   HST 03/03/23      IMPRESSION:  This HST confirms the presence of mild to moderate sleep apnea of strictly obstructive origin.  No central apneas were recorded, there was an accentuation in REM sleep seen.  This was not a REM dependent sleep apnea.  There was no associated hypoxia noted.  Interestingly , some of the loudest snoring was seen while the patient slept on her left side. Her current  AHI is exactly at the border point between mild and moderate sleep apnea.   RECOMMENDATION: This patient has started with nasal airflow, crossbite, chronic sinusitis postnasal drip.  I think her snoring would be better treated by addressing nasal patency and allergies.   If the patient is truly CPAP fatigued I would consider a sleep dentistry intervention could be of benefit.   If she is reluctantly willing to stay on CPAP I would still want her to use CPAP but with the treatment allowing a better nasal airway patency.  I would not change any of the settings.  Assessment/Plan: Encounter Diagnoses  Name Primary?   OSA (obstructive sleep apnea) Yes   Bilateral temporomandibular joint pain    Chronic nasal congestion    Environmental and seasonal allergies    Nasal septal deviation    Hypertrophy of both inferior nasal turbinates    Post-nasal drip    Environmental allergies     Assessment and Plan Assessment & Plan Chronic nasal congestion Deviated septum/ITH Septal deviation with a septal spur on the right side causing nasal obstruction, potentially affecting CPAP tolerance. She most likely also has environmental allergies based on exam. Surgical correction may improve CPAP tolerance but not cure sleep apnea. - Initiate Flonase nasal spray for congestion and drainage. - continue Allegra daily - [facility] [place] for intradermal allergy testing. - Consider septoplasty if symptoms persist despite medical management and allergy testing.  Obstructive sleep apnea Moderate obstructive sleep apnea with sporadic CPAP use due to mask discomfort. Persistent symptoms despite weight loss. CPAP intolerance not primarily due to nasal obstruction. - Continue CPAP therapy and attempt to increase tolerance. - Consider septoplasty if CPAP intolerance persists after addressing nasal obstruction and allergy management.  Tinnitus Significant tinnitus with hearing loss  managed with hearing aids. No new treatments available beyond masking and white noise. - Use hearing aids with masking feature. - Consider white noise or distraction techniques. - had recent audiogram and does not wish to have it done at this time  Itchy ears Itchy ears likely due to dryness without eczema. - Use over-the-counter sweet oil in the ear canal a couple of times a week.      Thank you for allowing me to participate in the care of this patient. Please do not hesitate to contact me with any questions or concerns.   Ashok Croon, MD Otolaryngology [facility] Phone: [phone] Fax: [phone]    07/17/2023, 8:52 AM

## 2023-07-18 ENCOUNTER — Ambulatory Visit: Admitting: Neurology

## 2023-07-18 DIAGNOSIS — J3089 Other allergic rhinitis: Secondary | ICD-10-CM

## 2023-07-18 DIAGNOSIS — G478 Other sleep disorders: Secondary | ICD-10-CM

## 2023-07-18 DIAGNOSIS — G4733 Obstructive sleep apnea (adult) (pediatric): Secondary | ICD-10-CM

## 2023-07-18 DIAGNOSIS — R0982 Postnasal drip: Secondary | ICD-10-CM

## 2023-07-22 ENCOUNTER — Ambulatory Visit (INDEPENDENT_AMBULATORY_CARE_PROVIDER_SITE_OTHER)

## 2023-07-22 ENCOUNTER — Encounter: Payer: Self-pay | Admitting: Podiatry

## 2023-07-22 ENCOUNTER — Ambulatory Visit (INDEPENDENT_AMBULATORY_CARE_PROVIDER_SITE_OTHER): Admitting: Podiatry

## 2023-07-22 DIAGNOSIS — M778 Other enthesopathies, not elsewhere classified: Secondary | ICD-10-CM | POA: Diagnosis not present

## 2023-07-22 NOTE — Patient Instructions (Addendum)

## 2023-07-26 NOTE — Progress Notes (Signed)
  Subjective:  Patient ID: Katelyn Wagner, female    DOB: 07/23/1965,  MRN: [id number]  Chief Complaint  Patient presents with   Foot Pain    RM#11 Left foot pain on outside part of foot no injuries patient states just started to hurt.    Discussed the use of AI scribe software for clinical note transcription with the patient, who gave verbal consent to proceed.  History of Present Illness The patient, with a history of foot pain, presents with recurrent pain on the outside part of the foot. The pain started a few days ago without any identifiable trigger. The patient reports increased pain with walking, especially after a [place] the airport. The pain was severe enough to cause the patient to alter her gait and rely on her suitcase for support. The patient managed the pain with ibuprofen and icing, which resulted in a 70% improvement. However, the pain persists, especially when walking barefoot. The patient denies any noticeable swelling or changes in activity or footwear that could have contributed to the pain. The patient also mentions an increase in sugar intake over the weekend. Additionally, the patient reports a history of a wart on the foot that has been frozen multiple times.      Objective:    Physical Exam General: AAO x3, NAD  Dermatological: On the plantar aspect the left foot there is a skin lesion which was recently frozen.  Clinically does appear to be a plantar wart.  No signs of infection.  Vascular: Dorsalis Pedis artery and Posterior Tibial artery pedal pulses are 2/4 bilateral with immedate capillary fill time. There is no pain with calf compression, swelling, warmth, erythema.   Neruologic: Grossly intact via light touch bilateral.   Musculoskeletal: There is tenderness palpation of the 4th and 5th metatarsal cuboid joint.  There is some localized edema present at this area compared to the contralateral extremity there is no erythema or  warmth.  Flexor, extensor tendons appear to be intact.  No erythema point tenderness otherwise.  MMT 5/5.  She does supinate upon weightbearing.  Gait: Unassisted, Nonantalgic.     No images are attached to the encounter.    Results RADIOLOGY Left foot x-ray: Calcaneal spur present. Changes noted along the fifth metatarsal-cuboid joint. Joint space appears indistinct. (07/21/2023)   Assessment:   1. Capsulitis of left foot      Plan:  Patient was evaluated and treated and all questions answered.  Assessment and Plan Assessment & Plan Left foot pain with possible arthritis Intermittent pain at the left fourth and fifth metatarsal-cuboid joint, likely due to joint inflammation and foot structure. X-rays show slight swelling and possible arthritic changes. - Recommend supportive shoes such as [employer]ics Nimbus or [employer]. - Prescribe ibuprofen 800 mg up to three times daily as needed. - Advise icing the area for 20 minutes twice daily. - Discuss turmeric as a natural anti-inflammatory. - Provide calf stretch worksheet. - Recommend supportive hiking boots. - Discuss meloxicam as an alternative to ibuprofen, advise against concurrent use.   Wart on left foot Submetatarsal wart previously treated with cryotherapy. No open lesions. - Monitor for changes or persistence.   Return if symptoms worsen or fail to improve.   Charity Conch DPM

## 2023-08-06 NOTE — Progress Notes (Signed)
 [facility]   HOME SLEEP TEST REPORT ( by Katheen Palma  mail -out device )   STUDY DATE:  07-26-2023 Data received :  08-06-2023    ORDERING CLINICIAN: Neomia Banner, MD  REFERRING CLINICIAN:  Aldo Hun, MD  CLINICAL INFORMATION/HISTORY: Katelyn Wagner is a 58 y.o. female patient who is here for revisit 07/02/2023 for OSA with reluctant CPAP use.  Using a nasal cradle has been easier for her to use CPAP but she still struggles..  Had paused using CPAP for 2-3 weeks to recover from an upper airway cold, after she  went to [place] where she had contracted a febrile viral illness.    Chief concern according to patient :  " I need to find a better way for CPAP fit "   Alternative therapies discussed.  She has a history of TMJ and is reluctant to use a dental device ,buts also not compliant with CPAP.  I will prescribe nasal prongs ( pillows  for her ).  I ordered a HST.    Epworth sleepiness score: 5 /24. BMI: 22.7  kg/m Neck Circumference: 15.5"   FINDINGS:  Sleep Summary:   Total Recording Time (hours, min):   8 hours 33 minutes  Total Sleep Time (hours, min):   6 hours and 15 minutes  Sleep efficiency %;   73% following a sleep latency of only 2 minutes.                                    Respiratory Indices by AASM :   Calculated pAHI (per hour):      Using AASM criteria of scoring the patient's calculated total apnea-hypopnea index per hour was 1.3/h                                           Positional  respiratory activity  / snoring : There were only small periods of bursts of chest wall vibrations and these could have been coughing spells.   There is mild snoring present while the patient slept in supine and she changed her sleep position by about 1:15 AM to prone sleep during which there was no longer any snoring present..  Oxygen Saturation  in Sleep    Oxygen Saturation (%) Mean: Oxygen saturation during sleep was at mean value  93.9% and varied between a minimum saturation of 87.5 and maximum saturation of 99.9%.  There were only 7 desaturation events present during this sleep study.                                    O2 Saturation (minutes) <89%:   0 minutes        Pulse Rate in Sleep :   Pulse Mean (bpm):   Mean heart rate during sleep was 53 bpm and varied between a minimum of 44 and a maximum of 74 bpm.  Using a sensor device by Basilio Both allows us  to see the EKG correlated to the respiratory activity and this documented normal sinus rhythm throughout.               IMPRESSION:  This HST confirms that no obstructive  sleep apnea is present.  An AHI under 5/h is considered physiologically normal.    RECOMMENDATION: D/C CPAP use.    INTERPRETING PHYSICIAN:  Neomia Banner, MD  [facility] Sleep Board certified by The [employer] of Sleep Medicine and Diplomate of the [employer] of Sleep Medicine. Board certified In Neurology through the ABPN, Fellow of the [employer] of Neurology.

## 2023-08-12 ENCOUNTER — Ambulatory Visit (INDEPENDENT_AMBULATORY_CARE_PROVIDER_SITE_OTHER)

## 2023-08-12 ENCOUNTER — Ambulatory Visit (INDEPENDENT_AMBULATORY_CARE_PROVIDER_SITE_OTHER): Admitting: Podiatry

## 2023-08-12 DIAGNOSIS — M7751 Other enthesopathy of right foot: Secondary | ICD-10-CM | POA: Diagnosis not present

## 2023-08-12 DIAGNOSIS — M79671 Pain in right foot: Secondary | ICD-10-CM | POA: Diagnosis not present

## 2023-08-12 DIAGNOSIS — M19071 Primary osteoarthritis, right ankle and foot: Secondary | ICD-10-CM

## 2023-08-12 DIAGNOSIS — B070 Plantar wart: Secondary | ICD-10-CM

## 2023-08-12 DIAGNOSIS — M19072 Primary osteoarthritis, left ankle and foot: Secondary | ICD-10-CM

## 2023-08-12 DIAGNOSIS — M778 Other enthesopathies, not elsewhere classified: Secondary | ICD-10-CM | POA: Diagnosis not present

## 2023-08-12 DIAGNOSIS — M79672 Pain in left foot: Secondary | ICD-10-CM

## 2023-08-12 MED ORDER — MELOXICAM 15 MG PO TABS: 15.0000 mg | ORAL_TABLET | Freq: Every day | ORAL | 0 refills | Status: AC | PRN

## 2023-08-13 ENCOUNTER — Encounter: Payer: Self-pay | Admitting: Neurology

## 2023-08-13 ENCOUNTER — Ambulatory Visit: Admitting: Podiatry

## 2023-08-13 NOTE — Procedures (Signed)
 [facility]   HOME SLEEP TEST REPORT ( by Katheen Palma  mail -out device )   STUDY DATE:  07-26-2023 Data received :  08-06-2023    ORDERING CLINICIAN: Neomia Banner, MD  REFERRING CLINICIAN:  Aldo Hun, MD  CLINICAL INFORMATION/HISTORY: Katelyn Wagner is a 58 y.o. female patient who is here for revisit 07/02/2023 for OSA with reluctant CPAP use.  Using a nasal cradle has been easier for her to use CPAP but she still struggles..  Had paused using CPAP for 2-3 weeks to recover from an upper airway cold, after she  went to [place] where she had contracted a febrile viral illness.    Chief concern according to patient :  " I need to find a better way for CPAP fit "   Alternative therapies discussed.  She has a history of TMJ and is reluctant to use a dental device ,buts also not compliant with CPAP.  I will prescribe nasal prongs ( pillows  for her ).  I ordered a HST.    Epworth sleepiness score: 5 /24. BMI: 22.7  kg/m Neck Circumference: 15.5"   FINDINGS:  Sleep Summary:   Total Recording Time (hours, min):   8 hours 33 minutes  Total Sleep Time (hours, min):   6 hours and 15 minutes  Sleep efficiency %;   73% following a sleep latency of only 2 minutes.                                    Respiratory Indices by AASM :   Calculated pAHI (per hour):      Using AASM criteria of scoring the patient's calculated total apnea-hypopnea index per hour was 1.3/h                                           Positional  respiratory activity  / snoring : There were only small periods of bursts of chest wall vibrations and these could have been coughing spells.   There is mild snoring present while the patient slept in supine and she changed her sleep position by about 1:15 AM to prone sleep during which there was no longer any snoring present..  Oxygen Saturation  in Sleep    Oxygen Saturation (%) Mean: Oxygen saturation during sleep was at mean value 93.9% and  varied between a minimum saturation of 87.5 and maximum saturation of 99.9%.  There were only 7 desaturation events present during this sleep study.                                    O2 Saturation (minutes) <89%:   0 minutes        Pulse Rate in Sleep :   Pulse Mean (bpm):   Mean heart rate during sleep was 53 bpm and varied between a minimum of 44 and a maximum of 74 bpm.  Using a sensor device by Basilio Both allows us  to see the EKG correlated to the respiratory activity and this documented normal sinus rhythm throughout.               IMPRESSION:  This HST confirms that no obstructive sleep apnea is present.  An AHI under 5/h is considered physiologically normal.    RECOMMENDATION: D/C CPAP use.    INTERPRETING PHYSICIAN:  Neomia Banner, MD  [facility] and [facility] Sleep Board certified by The [employer] of Sleep Medicine and Diplomate of the [employer] of Sleep Medicine. Board certified In Neurology through the ABPN, Fellow of the [employer] of Neu[employer].

## 2023-08-14 ENCOUNTER — Encounter: Payer: Self-pay | Admitting: Neurology

## 2023-08-14 NOTE — Progress Notes (Signed)
  Subjective:  Patient ID: Katelyn Wagner, female    DOB: 1965-12-07,  MRN: [id number]  Chief Complaint  Patient presents with   Foot Pain    RM#11 Left foot pain not getting better.    History of Present Illness The patient, with a history of foot pain, presents with recurrent pain on the outside part of the foot.  States that she still pain discomfort.  Does help when she takes ibuprofen.  She said that some days are better than others.  She said that she has some very mild pain in the outside of the right foot as well in the same area.  When she does take ibuprofen it helps and she takes this intermittently.  She states that she had this foot pain multiple, many couple months ago and lasted a couple days.  Prior to that she did feel a pop in her foot that she reports today.  Hiking boots most days with great relief.  She also purchased new shoes.  She has inserts in her shoes.  See attached note that she wrote me.  As well as her questions.        Objective:    Physical Exam General: AAO x3, NAD  Dermatological: The plantar aspect the left foot where she had the wart out previously there is 1 small dark spot that remains.  There is no open lesions identified.  No edema, erythema.  Vascular: Dorsalis Pedis artery and Posterior Tibial artery pedal pulses are 2/4 bilateral with immedate capillary fill time. There is no pain with calf compression, swelling, warmth, erythema.   Neruologic: Grossly intact via light touch bilateral.   Musculoskeletal: There is tenderness palpation of the 4th and 5th metatarsal cuboid joint.  There is some localized edema present at this area compared to the contralateral extremity there is no erythema or warmth.  She has some mild discomfort in the same area on the right foot.  Unable to appreciate any area pinpoint tenderness.  Flexor, extensor tendons appear to be intact.  No erythema point tenderness otherwise.  MMT 5/5.  She does supinate upon  weightbearing.  Gait: Unassisted, Nonantalgic.     Results RADIOLOGY Right foot x-ray: Mild inferior calcaneal spur present.  Some adaptive changes are noted along the calcaneocuboid joint.   Assessment:   1. Capsulitis of left foot      Plan:  Patient was evaluated and treated and all questions answered.  Assessment and Plan Assessment & Plan Left and right foot pain with possible Arthritis/capsulitis -I have the lateral posterior insert to see if this will help take pressure off. -We discussed walking as tolerated.  Discussed stretching, icing a regular basis. -Referral to physical therapy placed to [facility] -We discussed sandals to provide relief as well. -Follow-up with Kerney Pee for possible insert modifications. -Advised not to walk barefoot -Anti-inflammatories as needed -Low impact activities - MRI ordered for ankles given the discomfort.  Wart - She has cream that was prescribed by her primary care doctor to continue using.

## 2023-08-18 ENCOUNTER — Ambulatory Visit: Attending: Surgery | Admitting: Surgery

## 2023-08-18 ENCOUNTER — Encounter: Payer: Self-pay | Admitting: Surgery

## 2023-08-18 VITALS — BP 113/73 | HR 68 | Temp 98.1°F | Ht 68.5 in | Wt 163.0 lb

## 2023-08-18 DIAGNOSIS — I6521 Occlusion and stenosis of right carotid artery: Secondary | ICD-10-CM

## 2023-08-18 NOTE — Progress Notes (Signed)
 [facility]  Patient name: Katelyn Wagner MRN: [id number] DOB: 19-Sep-1965 Sex: female   REQUESTING PROVIDER:    Dr. Genelle Kennedy   REASON FOR CONSULT:    Carotid stenosis  HISTORY OF PRESENT ILLNESS:   Katelyn Wagner is a 58 y.o. female, who is referred for evaluation of carotid stenosis.  This was detected in December 2020 for when she went to the emergency department for bilateral blurry vision.  She had a carotid ultrasound that showed 40-59% right-sided stenosis.  She denies any localizing symptoms such as numbness or weakness in either extremity, slurred speech, or amaurosis fugax.  She is very worried about the possibility of vascular dementia as this is what affected her mother.  She takes a statin for hypercholesterolemia in addition to Zetia.  She is a non-smoker.  She has recently lost 30 pounds.  She was on CPAP but is no longer requiring this.  PAST MEDICAL HISTORY    Past Medical History:  Diagnosis Date   Allergy    sulfer dioxide   Carotid artery stenosis    Hypercholesteremia    Pain in Achilles tendon    bilateral   Sleep apnea    does not use CPAP   TMJ (dislocation of temporomandibular joint)      FAMILY HISTORY   Family History  Problem Relation Age of Onset   Dementia Mother    Colon cancer Father 21   Colon polyps Brother    Esophageal cancer Neg Hx    Stomach cancer Neg Hx    Rectal cancer Neg Hx     SOCIAL HISTORY:   Social History   Socioeconomic History   Marital status: Married    Spouse name: Not on file   Number of children: 2   Years of education: Not on file   Highest education level: Not on file  Occupational History   Not on file  Tobacco Use   Smoking status: Never   Smokeless tobacco: Never  Vaping Use   Vaping status: Never Used  Substance and Sexual Activity   Alcohol use: Not Currently   Drug use: No   Sexual activity: Not on file  Other  Topics Concern   Not on file  Social History Narrative   Right handed   Wears glasses    Social Drivers of Health   Financial Resource Strain: Not on file  Food Insecurity: Not on file  Transportation Needs: Not on file  Physical Activity: Not on file  Stress: Not on file  Social Connections: Not on file  Intimate Partner Violence: Not on file    ALLERGIES:    Allergies  Allergen Reactions   Sulfur Dioxide Other (See Comments)    "throat tighten"   Pine Itching    Pine nuts    CURRENT MEDICATIONS:    Current Outpatient Medications  Medication Sig Dispense Refill   Ascorbic Acid (VITAMIN C GUMMIE PO) Take by mouth.     fexofenadine  (ALLEGRA  ALLERGY) 60 MG tablet Take 1 tablet (60 mg total) by mouth 2 (two) times daily. 30 tablet 2   fluorouracil  (EFUDEX ) 5 % cream Apply topically 2 (two) times daily. 40 g 1   fluticasone  (FLONASE ) 50 MCG/ACT nasal spray Place 2 sprays into both nostrils 2 (two) times daily. 16 g 6   guaiFENesin  (MUCINEX ) 600 MG 12 hr tablet Take 1 tablet (600 mg total) by mouth 2 (two) times daily.     LORazepam (ATIVAN) 0.5 MG  tablet      meloxicam  (MOBIC ) 15 MG tablet Take 1 tablet (15 mg total) by mouth daily as needed for pain. 30 tablet 0   rosuvastatin (CRESTOR) 20 MG tablet Take 20 mg by mouth daily.     sertraline (ZOLOFT) 50 MG tablet Take 1 tablet by mouth daily.     No current facility-administered medications for this visit.    REVIEW OF SYSTEMS:   [X]  denotes positive finding, [ ]  denotes negative finding Cardiac  Comments:  Chest pain or chest pressure:    Shortness of breath upon exertion:    Short of breath when lying flat:    Irregular heart rhythm:        Vascular    Pain in calf, thigh, or hip brought on by ambulation:    Pain in feet at night that wakes you up from your sleep:     Blood clot in your veins:    Leg swelling:         Pulmonary    Oxygen at home:    Productive cough:     Wheezing:         Neurologic     Sudden weakness in arms or legs:     Sudden numbness in arms or legs:     Sudden onset of difficulty speaking or slurred speech:    Temporary loss of vision in one eye:     Problems with dizziness:         Gastrointestinal    Blood in stool:      Vomited blood:         Genitourinary    Burning when urinating:     Blood in urine:        Psychiatric    Major depression:         Hematologic    Bleeding problems:    Problems with blood clotting too easily:        Skin    Rashes or ulcers:        Constitutional    Fever or chills:     PHYSICAL EXAM:   Vitals:   08/18/23 1356 08/18/23 1358  BP: 112/72 113/73  Pulse: 68   Temp: 98.1 F (36.7 C)   SpO2: 98%   Weight: 163 lb (73.9 kg)   Height: 5' 8.5" (1.74 m)     GENERAL: The patient is a well-nourished female, in no acute distress. The vital signs are documented above. CARDIAC: There is a regular rate and rhythm.  VASCULAR: No carotid bruits.  Palpable radial and pedal pulses PULMONARY: Nonlabored respirations ABDOMEN: Soft and non-tender.  No pulsatile mass MUSCULOSKELETAL: There are no major deformities or cyanosis. NEUROLOGIC: No focal weakness or paresthesias are detected. SKIN: There are no ulcers or rashes noted. PSYCHIATRIC: The patient has a normal affect.  STUDIES:   I have reviewed the following carotid duplex: Right Carotid: Velocities in the right ICA are consistent with a 40-59%                 stenosis.   Left Carotid: Velocities in the left ICA are consistent with a 1-39%  stenosis.   Vertebrals: Bilateral vertebral arteries demonstrate antegrade flow.  Subclavians: Normal flow hemodynamics were seen in bilateral subclavian               arteries.   ASSESSMENT and PLAN   Right carotid stenosis: Patient has mild disease on the right which is asymptomatic.  I have stressed the  importance of healthy living including diet and exercise.  I did tell her to start a 81 mg aspirin in addition to her  statin.  She will follow-up in 1 year with surveillance ultrasound   Marti Slates, MD, FACS [facility] [phone] Pager [phone]

## 2023-08-24 ENCOUNTER — Inpatient Hospital Stay: Admission: RE | Admit: 2023-08-24 | Source: Ambulatory Visit

## 2023-08-24 ENCOUNTER — Other Ambulatory Visit

## 2023-08-27 ENCOUNTER — Ambulatory Visit: Payer: 59 | Admitting: Neurology

## 2023-09-04 ENCOUNTER — Ambulatory Visit
Admission: RE | Admit: 2023-09-04 | Discharge: 2023-09-04 | Discharge status: Home / Self Care | Source: Ambulatory Visit | Attending: Podiatry

## 2023-09-04 DIAGNOSIS — M19071 Primary osteoarthritis, right ankle and foot: Secondary | ICD-10-CM

## 2023-09-09 ENCOUNTER — Encounter: Payer: Self-pay | Admitting: Podiatry

## 2023-09-09 ENCOUNTER — Ambulatory Visit (INDEPENDENT_AMBULATORY_CARE_PROVIDER_SITE_OTHER): Admitting: Podiatry

## 2023-09-09 DIAGNOSIS — B070 Plantar wart: Secondary | ICD-10-CM | POA: Diagnosis not present

## 2023-09-09 DIAGNOSIS — M778 Other enthesopathies, not elsewhere classified: Secondary | ICD-10-CM | POA: Diagnosis not present

## 2023-09-10 NOTE — Progress Notes (Signed)
  Subjective:  Patient ID: Katelyn Wagner, female    DOB: 1966/01/19,  MRN: [id number]  Chief Complaint  Patient presents with   Arthritis    RM#12 Follow up on arthritis of both feet patient states doing a bit better.      History of Present Illness The patient, with a history of foot pain, presents with recurrent pain on the outside part of the foot.  She presents to go over the MRI.  She just got started physical therapy as well.  She said overall she is feeling better she does not report any new concerns today.  She difficulty still if she goes without shoes but when she is wearing shoes she is feeling better.    Objective:    Physical Exam General: AAO x3, NAD  Dermatological: The plantar aspect the left foot where the previous wart was at with some hyperkeratotic tissue that debrided today.  No ongoing ulceration identified at this time and appears that the verruca is likely resolved.  Vascular: Dorsalis Pedis artery and Posterior Tibial artery pedal pulses are 2/4 bilateral with immedate capillary fill time. There is no pain with calf compression, swelling, warmth, erythema.   Neruologic: Grossly intact via light touch bilateral.   Musculoskeletal: There is no significant tenderness palpation of the 4th and 5th metatarsal cuboid joint.  There is trace edema there is no erythema or warmth.  Flexor, extensor tendons.  Intact.  MMT 5/5.  Gait: Unassisted, Nonantalgic.      Assessment:   1. Capsulitis of left foot      Plan:  Patient was evaluated and treated and all questions answered.  Assessment and Plan Assessment & Plan Left and right foot pain with possible Arthritis/capsulitis -Plan: Reviewed the MRI and I reviewed the images with her today.  Will await the results of the report from the radiologist.  Already did physical therapy, supportive shoe gear for now as she is continue to improve.  I will follow-up with her after I receive the results of the MRI  but if she has not heard back in the next 1 to 2 weeks to let me know - For the wart I sharply treated the callus and complications or bleeding.  This minimal bleeding.  Antibiotic ointment was applied followed by dressing.  Monitoring signs or symptoms of infection and/or reoccurrence.  Return if symptoms worsen or fail to improve.  Charity Conch DPM

## 2023-09-16 ENCOUNTER — Encounter: Payer: Self-pay | Admitting: Allergy & Immunology

## 2023-09-16 ENCOUNTER — Ambulatory Visit: Payer: Self-pay | Admitting: Podiatry

## 2023-09-16 ENCOUNTER — Other Ambulatory Visit: Payer: Self-pay

## 2023-09-16 ENCOUNTER — Ambulatory Visit: Admitting: Allergy & Immunology

## 2023-09-16 VITALS — BP 100/70 | HR 54 | Temp 97.5°F | Resp 16 | Ht 68.5 in | Wt 158.2 lb

## 2023-09-16 DIAGNOSIS — T7800XA Anaphylactic reaction due to unspecified food, initial encounter: Secondary | ICD-10-CM

## 2023-09-16 DIAGNOSIS — T7800XD Anaphylactic reaction due to unspecified food, subsequent encounter: Secondary | ICD-10-CM

## 2023-09-16 DIAGNOSIS — J310 Chronic rhinitis: Secondary | ICD-10-CM | POA: Diagnosis not present

## 2023-09-16 MED ORDER — IPRATROPIUM BROMIDE 0.03 % NA SOLN: 2.0000 | Freq: Three times a day (TID) | NASAL | 12 refills | Status: AC

## 2023-09-16 NOTE — Progress Notes (Addendum)
 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 Chronic rhinitis - Because of insurance stipulations, we cannot do skin testing on the same day as your first visit. - We are all working to fight this, but for now we need to do two separate visits.  - We will know more after we do testing at the next visit.  - The skin testing visit can be squeezed in at your convenience.  - Then we can make a more full plan to address all of your symptoms. - Be sure to stop your antihistamines for 3 days before this appointment.  - We will just do intradermal testing (more sensitive - using needles injection the allergen under the arm) since you had negative.  - START Atrovent  (ipratropium) one spray per nostril up to three times daily. - This can be over drying, so start low and go slow.   2. 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  - We will send in a nasal epinephrine device (this is just on the market and has a longer shelf life). - This is smaller and more portable.   3. Return in about 1 week (around 09/23/2023) for SKIN TESTING (ALL INTRADERMALS) . You can have the follow up appointment with Katelyn Wagner or a Nurse Practicioner (our Nurse Practitioners are excellent and always have Physician oversight!).     This note in its entirety was forwarded to the Provider who requested this consultation.  Subjective:   Katelyn Wagner is a 58 y.o. female presenting today for evaluation of  Chief Complaint  Patient presents with   Establish Care   Allergy Testing   Sinus Problem    Katelyn Wagner has a history of the following: Patient Active Problem List   Diagnosis Date Noted    Diplopia 05/20/2023   Non-seasonal allergic rhinitis 05/20/2023   Annular tear of lumbar disc 11/26/2022   Lumbar spondylosis 11/26/2022   Situational anxiety 11/26/2022   Noninsertional tendinopathy of both Achilles tendons 10/01/2022   Piriformis syndrome of right side 10/01/2022   OSA (obstructive sleep apnea) 07/03/2022   Snoring 07/03/2022   Chronic right-sided low back pain 04/16/2022   Pain in joint of left shoulder 04/16/2022   PND (post-nasal drip) 02/21/2022   Mild obstructive sleep apnea-hypopnea syndrome 03/05/2021   Non-restorative sleep 03/05/2021   NONSPECIFIC ABN FINDING RAD & OTH EXAM GI TRACT 06/05/2010    History obtained from: chart review and patient.  Katelyn Wagner was referred by Shayne Anes, MD.     Katelyn Wagner is a 58 y.o. female presenting for an evaluation of chronic rhinitis.  Asthma/Respiratory Symptom History: She has a history of sleep apnea, which was initially mild. After losing thirty pounds, she reports that her sleep apnea worsened, although a recent sleep study suggested it had resolved. She continues to experience significant snoring and suspects that her sinus drainage may contribute to her sleep issues.  Allergic Rhinitis Symptom History: She has a history of allergies since childhood, including allergies to dust, chocolate, and orange juice, which previously caused rashes on her arms. These symptoms have resolved, and she currently consumes chocolate and orange juice without issues. She has experienced chronic sinus drainage for  approximately a year, characterized by a sensation of drainage in the back of her throat. She has been using Zyrtec without relief and has nasal sprays available but has not used them. There is no worsening of symptoms around animals or during specific seasons, although she shares custody of two dogs with her ex-partner.  She has never been on allergen immunotherapy.  She is interested to see if there is something in the  environment that she can change.  It does look like she had lab work done in May 2024 2 and environmental allergy panel including indoor and outdoor allergens.  This was all negative.  She also had pine nut allergy at the time via the blood that was negative.  Food Allergy Symptom History: She recalls an allergic reaction to pine nuts three to four years ago, which she managed with Benadryl. Her daughter, who also has allergies, identified the reaction as an allergic response.   Infection Symptom History: She has not had any recent sinus infections, although she used to experience them frequently. She has not undergone a sinus CT but had a head MRI in December for double vision, which has resolved. She thinks the double vision was due to dry eyes.   Otherwise, there is no history of other atopic diseases, including drug allergies, stinging insect allergies, or contact dermatitis. There is no significant infectious history. Vaccinations are up to date.    Past Medical History: Patient Active Problem List   Diagnosis Date Noted   Diplopia 05/20/2023   Non-seasonal allergic rhinitis 05/20/2023   Annular tear of lumbar disc 11/26/2022   Lumbar spondylosis 11/26/2022   Situational anxiety 11/26/2022   Noninsertional tendinopathy of both Achilles tendons 10/01/2022   Piriformis syndrome of right side 10/01/2022   OSA (obstructive sleep apnea) 07/03/2022   Snoring 07/03/2022   Chronic right-sided low back pain 04/16/2022   Pain in joint of left shoulder 04/16/2022   PND (post-nasal drip) 02/21/2022   Mild obstructive sleep apnea-hypopnea syndrome 03/05/2021   Non-restorative sleep 03/05/2021   NONSPECIFIC ABN FINDING RAD & OTH EXAM GI TRACT 06/05/2010    Medication List:  Allergies as of 09/16/2023       Reactions   Sulfur Dioxide Other (See Comments)   throat tighten   Pine Itching   Pine nuts        Medication List        Accurate as of September 16, 2023 11:58 AM. If you have  any questions, ask your nurse or doctor.          cetirizine 10 MG tablet Commonly known as: ZYRTEC Take 10 mg by mouth daily.   ezetimibe 10 MG tablet Commonly known as: ZETIA Take 10 mg by mouth daily.   fexofenadine  60 MG tablet Commonly known as: Allegra  Allergy Take 1 tablet (60 mg total) by mouth 2 (two) times daily.   fluorouracil  5 % cream Commonly known as: Efudex  Apply topically 2 (two) times daily.   fluticasone  50 MCG/ACT nasal spray Commonly known as: FLONASE  Place 2 sprays into both nostrils 2 (two) times daily.   guaiFENesin  600 MG 12 hr tablet Commonly known as: Mucinex  Take 1 tablet (600 mg total) by mouth 2 (two) times daily.   ipratropium 0.03 % nasal spray Commonly known as: ATROVENT  Place 2 sprays into both nostrils 3 (three) times daily. Started by: Marty Morton Shaggy   LORazepam 0.5 MG tablet Commonly known as: ATIVAN   meloxicam  15 MG tablet Commonly known as: MOBIC   Take 1 tablet (15 mg total) by mouth daily as needed for pain.   rosuvastatin 20 MG tablet Commonly known as: CRESTOR Take 20 mg by mouth daily.   sertraline 50 MG tablet Commonly known as: ZOLOFT Take 1 tablet by mouth daily.   VITAMIN C GUMMIE PO Take by mouth.        Birth History: non-contributory  Developmental History: non-contributory  Past Surgical History: Past Surgical History:  Procedure Laterality Date   COLONOSCOPY  03/04/2016   KatelynJacobs   DILATION AND CURETTAGE OF UTERUS  2010 &2005   KNEE ARTHROSCOPY  1990   left   RETINAL LASER PROCEDURE     WISDOM TOOTH EXTRACTION       Family History: Family History  Problem Relation Age of Onset   Allergic rhinitis Mother    Dementia Mother    Allergic rhinitis Father    Colon cancer Father 46   Colon polyps Brother    Allergic rhinitis Daughter    Allergic rhinitis Daughter    Esophageal cancer Neg Hx    Stomach cancer Neg Hx    Rectal cancer Neg Hx      Social History: Tesslyn lives at  home with her two dogs. She lives in an apartment that is ten years old.  She is not a smoker.  She is in the middle of a divorce.  She is looking forward to possibly getting her divorce papers today.  She has vinyl flooring in the main living areas and carpeting in the bedroom.  She has electric heating and central cooling.  There are dust mite covers on the bed, but not the pillows.  There is no tobacco exposure.  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  There is no fume, chemical, or dust exposure.  There is no HEPA filter in the home.  She does not live near an interstate or industrial area.  She has 2 daughters who are out of the home.  They are both interested in pursuing science careers.   Review of systems otherwise negative other than that mentioned in the HPI.    Objective:   Blood pressure 100/70, pulse (!) 54, temperature (!) 97.5 F (36.4 C), resp. rate 16, height 5' 8.5 (1.74 m), weight 158 lb 3.2 oz (71.8 kg), SpO2 98%. Body mass index is 23.7 kg/m.     Physical Exam Vitals reviewed.  Constitutional:      Appearance: She is well-developed.     Comments: Pleasant.  Cooperative with the exam.  HENT:     Head: Normocephalic and atraumatic.     Right Ear: Tympanic membrane, ear canal and external ear normal. No drainage, swelling or tenderness. Tympanic membrane is not injected, scarred, erythematous, retracted or bulging.     Left Ear: Tympanic membrane, ear canal and external ear normal. No drainage, swelling or tenderness. Tympanic membrane is not injected, scarred, erythematous, retracted or bulging.     Nose: No nasal deformity, septal deviation, mucosal edema or rhinorrhea.     Right Turbinates: Enlarged, swollen and pale.     Left Turbinates: Enlarged, swollen and pale.     Right Sinus: No maxillary sinus tenderness or frontal sinus tenderness.     Left Sinus: No maxillary sinus  tenderness or frontal sinus tenderness.     Mouth/Throat:     Mouth: Mucous membranes are not pale and not dry.     Pharynx: Uvula  midline.  Eyes:     General: Lids are normal. Allergic shiner present. No visual field deficit.       Right eye: No discharge.        Left eye: No discharge.     Conjunctiva/sclera: Conjunctivae normal.     Right eye: Right conjunctiva is not injected. No chemosis.    Left eye: Left conjunctiva is not injected. No chemosis.    Pupils: Pupils are equal, round, and reactive to light.  Cardiovascular:     Rate and Rhythm: Normal rate and regular rhythm.     Heart sounds: Normal heart sounds.  Pulmonary:     Effort: Pulmonary effort is normal. No tachypnea, accessory muscle usage or respiratory distress.     Breath sounds: Normal breath sounds. No wheezing, rhonchi or rales.     Comments: Moving air well in all lung fields.  No increased work of breathing. Chest:     Chest wall: No tenderness.  Abdominal:     Tenderness: There is no abdominal tenderness. There is no guarding or rebound.  Lymphadenopathy:     Head:     Right side of head: No submandibular, tonsillar or occipital adenopathy.     Left side of head: No submandibular, tonsillar or occipital adenopathy.     Cervical: No cervical adenopathy.  Skin:    General: Skin is warm.     Capillary Refill: Capillary refill takes less than 2 seconds.     Coloration: Skin is not pale.     Findings: No abrasion, erythema, petechiae or rash. Rash is not papular, urticarial or vesicular.     Comments: No eczematous or urticarial lesions noted.  Neurological:     Mental Status: She is alert.  Psychiatric:        Behavior: Behavior is cooperative.      Diagnostic studies: deferred due to insurance stipulations that require a separate visit for testing           Marty Shaggy, MD [facility] 

## 2023-09-16 NOTE — Patient Instructions (Addendum)
 1. Chronic rhinitis - Because of insurance stipulations, we cannot do skin testing on the same day as your first visit. - We are all working to fight this, but for now we need to do two separate visits.  - We will know more after we do testing at the next visit.  - The skin testing visit can be squeezed in at your convenience.  - Then we can make a more full plan to address all of your symptoms. - Be sure to stop your antihistamines for 3 days before this appointment.  - We will just do intradermal testing (more sensitive - using needles injection the allergen under the arm) since you had negative.  - START Atrovent (ipratropium) one spray per nostril up to three times daily. - This can be over drying, so start low and go slow.   2. 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  - We will send in a nasal epinephrine device (this is just on the market and has a longer shelf life). - This is smaller and more portable.   3. Return in about 1 week (around 09/23/2023) for SKIN TESTING (ALL INTRADERMALS) . You can have the follow up appointment with Dr. Idolina Maker or a Nurse Practicioner (our Nurse Practitioners are excellent and always have Physician oversight!).    Please inform us  of any Emergency Department visits, hospitalizations, or changes in symptoms. Call us  before going to the ED for breathing or allergy symptoms since we might be able to fit you in for a sick visit. Feel free to contact us  anytime with any questions, problems, or concerns.  It was a pleasure to meet you today!  Websites that have reliable patient information: 1. American Academy of Asthma, Allergy, and Immunology: www.aaaai.org 2. Food Allergy Research and Education (FARE): foodallergy.org 3. Mothers of Asthmatics: http://www.asthmacommunitynetwork.org 4. American College of Allergy, Asthma, and Immunology: www.acaai.org      "Like" us  on Facebook and Instagram for our latest updates!       A healthy democracy works best when [employer] participate! Make sure you are registered to vote! If you have moved or changed any of your contact information, you will need to get this updated before voting! Scan the QR codes below to learn more!

## 2023-09-22 ENCOUNTER — Other Ambulatory Visit

## 2023-10-02 ENCOUNTER — Ambulatory Visit: Admitting: Allergy & Immunology

## 2023-10-02 ENCOUNTER — Encounter: Payer: Self-pay | Admitting: Allergy & Immunology

## 2023-10-02 DIAGNOSIS — J302 Other seasonal allergic rhinitis: Secondary | ICD-10-CM | POA: Diagnosis not present

## 2023-10-02 DIAGNOSIS — J3089 Other allergic rhinitis: Secondary | ICD-10-CM

## 2023-10-02 DIAGNOSIS — T7805XD Anaphylactic reaction due to tree nuts and seeds, subsequent encounter: Secondary | ICD-10-CM

## 2023-10-02 MED ORDER — NEFFY 2 MG/0.1ML NA SOLN: 2.0000 mg | Freq: Two times a day (BID) | NASAL | 0 refills | Status: DC | PRN

## 2023-10-02 NOTE — Progress Notes (Signed)
 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 Chronic rhinitis - Testing today showed: grasses and indoor molds - Copy of test results provided.  - Avoidance measures provided. - Continue with: Atrovent  (ipratropium) one spray per nostril up to three times daily. - You can use an extra dose of the antihistamine, if needed, for breakthrough symptoms.  - Consider nasal saline rinses 1-2 times daily to remove allergens from the nasal cavities as well as help with mucous clearance (this is especially helpful to do before the nasal sprays are given) - Consider allergy shots as a means of long-term control. - Allergy shots re-train and reset the immune system to ignore environmental allergens and decrease the resulting immune response to those allergens (sneezing, itchy watery eyes, runny nose, nasal congestion, etc).    - Allergy shots improve symptoms in 75-85% of patients.  - We can discuss more at the next appointment if the medications are not working for you. - I am not convinced that allergy shots would help in your case (since your grass was minimally reactive and the indoor molds are likely not relevant).   2. 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  - We will send in a nasal epinephrine device (this is just on the market and has a longer shelf life). - This is smaller and more portable.  - We will send Neffy to [facility] out of [place] .   3. Return in about 6 months (around 04/02/2024). You can have the follow up appointment with Dr. Iva or a Nurse Practicioner (our Nurse Practitioners are excellent and always have Physician oversight!).   Subjective:   Katelyn Wagner is a 58 y.o. female presenting today for follow up of No chief complaint on file.   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  Discussed the use of AI scribe software for clinical note transcription with the patient and/or guardian, who gave verbal consent to proceed.  Katelyn Wagner is a 58 y.o. female presenting for skin testing. She was last seen on June 10th. We could not do testing because her insurance company does not cover testing on the same day as a New Patient visit. She has been off of all antihistamines 3 days in anticipation of the testing.   We started Atrovent  1 spray per nostril up to 3 times daily to help with rhinorrhea.  For her anaphylaxis to pine nut, we recommended continued avoidance.  We sent in Neffy as her epinephrine  medication.  She had lab work done in 2024 that was negative to indoor and outdoor allergens.  Find out allergy testing was negative as well.  Otherwise, there have been no changes to her past medical history, surgical history, family history, or social history.    Review of systems otherwise negative other than that mentioned in the HPI.    Objective:   There were no vitals taken for this visit. There is  no height or weight on file to calculate BMI.    Physical exam deferred since this was a skin testing appointment only.   Diagnostic studies:   Allergy Studies:     Intradermal - 10/02/23 0914     Time Antigen Placed 0914    Allergen Manufacturer Jestine    Location Arm    Number of Test 16    Intradermal Select    Control Negative    B[place]ia 1+    [place] 1+    [place] 1+    7 Grass 1+    Ragweed Mix Negative    Weed Mix Negative    Tree Mix Negative    Mold 1 Negative    Mold 2 1+    Mold 3 Negative    Mold 4 3+    Mite Mix Negative    Cat Negative    Dog Negative    Cockroach Negative          Allergy testing results were read and interpreted by myself, documented by clinical staff.      Marty Shaggy, MD  [facility] 

## 2023-10-02 NOTE — Patient Instructions (Addendum)
 1. Chronic rhinitis - Testing today showed: grasses and indoor molds - Copy of test results provided.  - Avoidance measures provided. - Continue with: Atrovent  (ipratropium) one spray per nostril up to three times daily. - You can use an extra dose of the antihistamine, if needed, for breakthrough symptoms.  - Consider nasal saline rinses 1-2 times daily to remove allergens from the nasal cavities as well as help with mucous clearance (this is especially helpful to do before the nasal sprays are given) - Consider allergy shots as a means of long-term control. - Allergy shots re-train and reset the immune system to ignore environmental allergens and decrease the resulting immune response to those allergens (sneezing, itchy watery eyes, runny nose, nasal congestion, etc).    - Allergy shots improve symptoms in 75-85% of patients.  - We can discuss more at the next appointment if the medications are not working for you. - I am not convinced that allergy shots would help in your case (since your grass was minimally reactive and the indoor molds are likely not relevant).   2. 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  - We will send in a nasal epinephrine device (this is just on the market and has a longer shelf life). - This is smaller and more portable.  - We will send Neffy to [facility] out of [place] .   3. Return in about 6 months (around 04/02/2024). You can have the follow up appointment with Dr. Iva or a Nurse Practicioner (our Nurse Practitioners are excellent and always have Physician oversight!).    Please inform us  of any Emergency Department visits, hospitalizations, or changes in symptoms. Call us  before going to the ED for breathing or allergy symptoms since we might be able to fit you in for a sick visit. Feel free to contact us  anytime with any questions, problems, or concerns.  It was a pleasure to see you again today  Try out Dr. Laymon Vicci Legions: [phone]  Websites that have reliable patient information: 1. American Academy of Asthma, Allergy, and Immunology: www.aaaai.org 2. Food Allergy Research and Education (FARE): foodallergy.org 3. Mothers of Asthmatics: http://www.asthmacommunitynetwork.org 4. American College of Allergy, Asthma, and Immunology: www.acaai.org      "Like" us  on Facebook and Instagram for our latest updates!      A healthy democracy works best when [employer] participate! Make sure you are registered to vote! If you have moved or changed any of your contact information, you will need to get this updated before voting! Scan the QR codes below to learn more!        Intradermal - 10/02/23 0914     Time Antigen Placed 0914    Allergen Manufacturer Jestine    Location Arm    Number of Test 55    Intradermal Select    Control Negative    [place] 1+    [place] 1+    [place] 1+    7 Grass 1+    Ragweed Mix Negative    Weed Mix Negative    Tree Mix Negative    Mold 1 Negative    Mold 2 1+    Mold 3 Negative    Mold 4 3+    Mite Mix Negative    Cat Negative    Dog Negative    Cockroach Negative          Reducing Pollen Exposure  The American Academy of Allergy, Asthma and Immunology  suggests the following steps to reduce your exposure to pollen during allergy seasons.    Do not hang sheets or clothing out to dry; pollen may collect on these items. Do not mow lawns or spend time around freshly cut grass; mowing stirs up pollen. Keep windows closed at night.  Keep car windows closed while driving. Minimize morning activities outdoors, a time when pollen counts are usually at their highest. Stay indoors as much as possible when pollen counts or humidity is high and on windy days when pollen tends to remain in the air longer. Use air conditioning when possible.  Many air conditioners have filters that trap the pollen spores. Use a HEPA room air filter to remove pollen form  the indoor air you breathe.  Control of Mold Allergen   Mold and fungi can grow on a variety of surfaces provided certain temperature and moisture conditions exist.  Outdoor molds grow on plants, decaying vegetation and soil.  The major outdoor mold, Alternaria and Cladosporium, are found in very high numbers during hot and dry conditions.  Generally, a late Summer - Fall peak is seen for common outdoor fungal spores.  Rain will temporarily lower outdoor mold spore count, but counts rise rapidly when the rainy period ends.  The most important indoor molds are Aspergillus and Penicillium.  Dark, humid and poorly ventilated basements are ideal sites for mold growth.  The next most common sites of mold growth are the bathroom and the kitchen.   Indoor (Perennial) Mold Control   Positive indoor molds via skin testing: Aspergillus, Penicillium, Fusarium, Aureobasidium (Pullulara), and Rhizopus  Maintain humidity below 50%. Clean washable surfaces with 5% bleach solution. Remove sources e.g. contaminated carpets.

## 2023-10-08 ENCOUNTER — Telehealth: Payer: Self-pay | Admitting: Allergy & Immunology

## 2023-10-08 NOTE — Telephone Encounter (Signed)
 Phx called to advise that Neffy  RX is incorrectly filled. I asked for clarification to provide DR and was told that he would know what to fix on there

## 2023-10-09 MED ORDER — NEFFY 2 MG/0.1ML NA SOLN: 2.0000 mg | NASAL | 1 refills | Status: AC | PRN

## 2023-10-09 NOTE — Telephone Encounter (Signed)
 Ooops - fixed.     Marty Shaggy, MD [facility] 

## 2023-11-19 IMAGING — DX DG FOOT COMPLETE 3+V*L*
3 series · 3 of 3 positions shown · non-contrast
Comparison: Left ankle radiographs 12/24/2014

CLINICAL DATA: Left foot pain.  Peroneal tendinitis.

EXAM:
LEFT FOOT - COMPLETE 3+ VIEW

[foot ap wb]
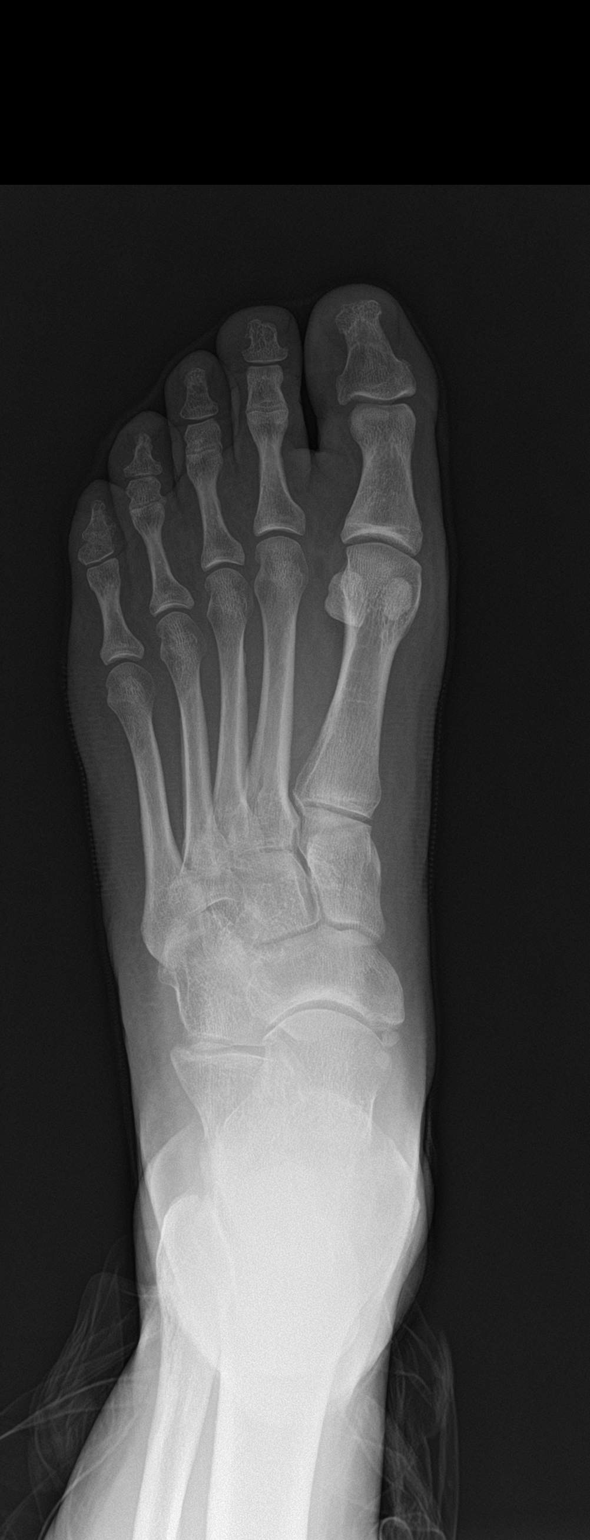

[foot obl wb]
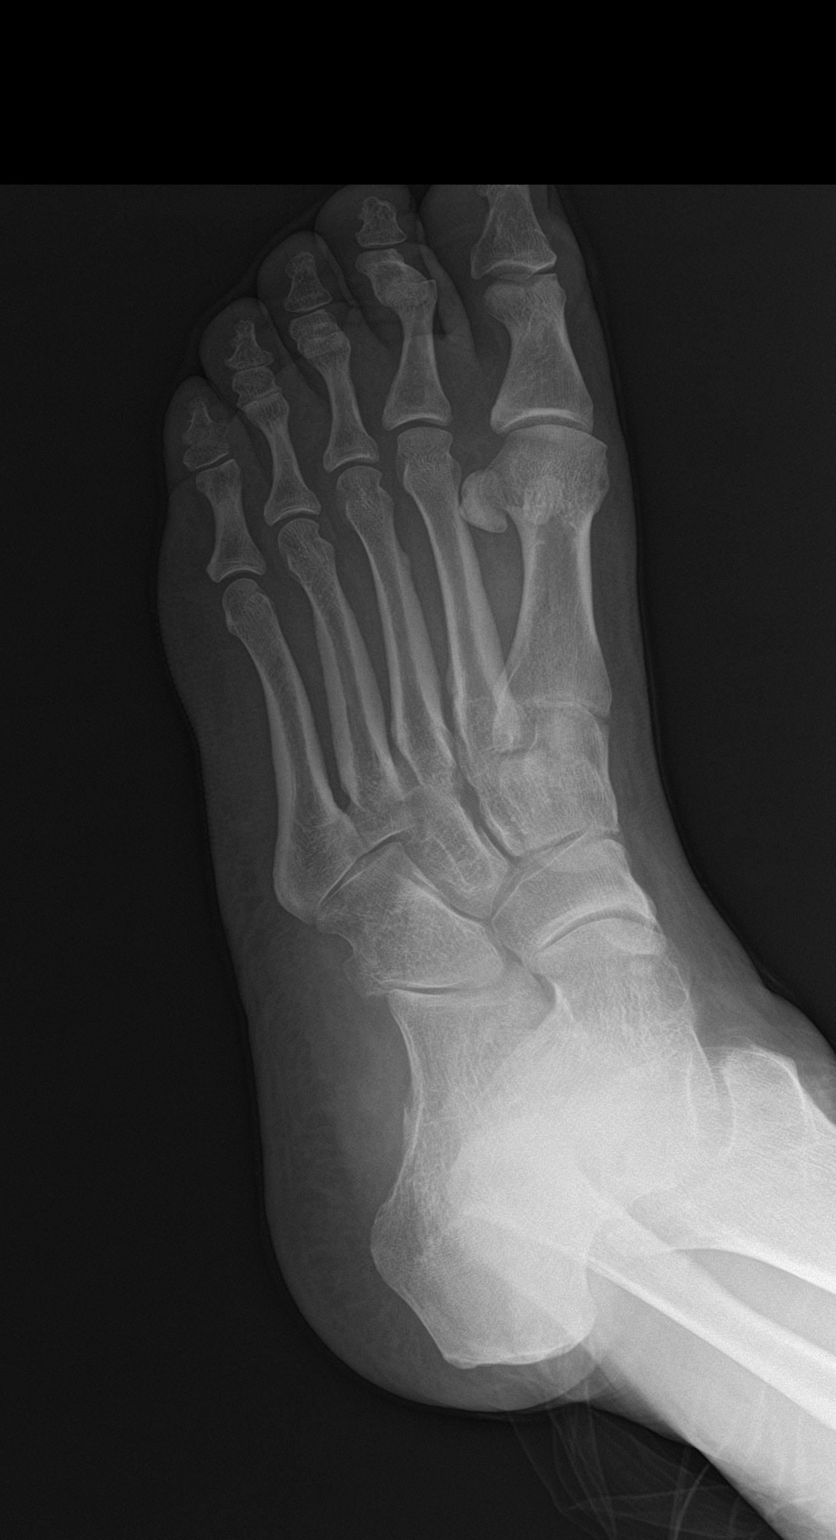

[foot lat wb]
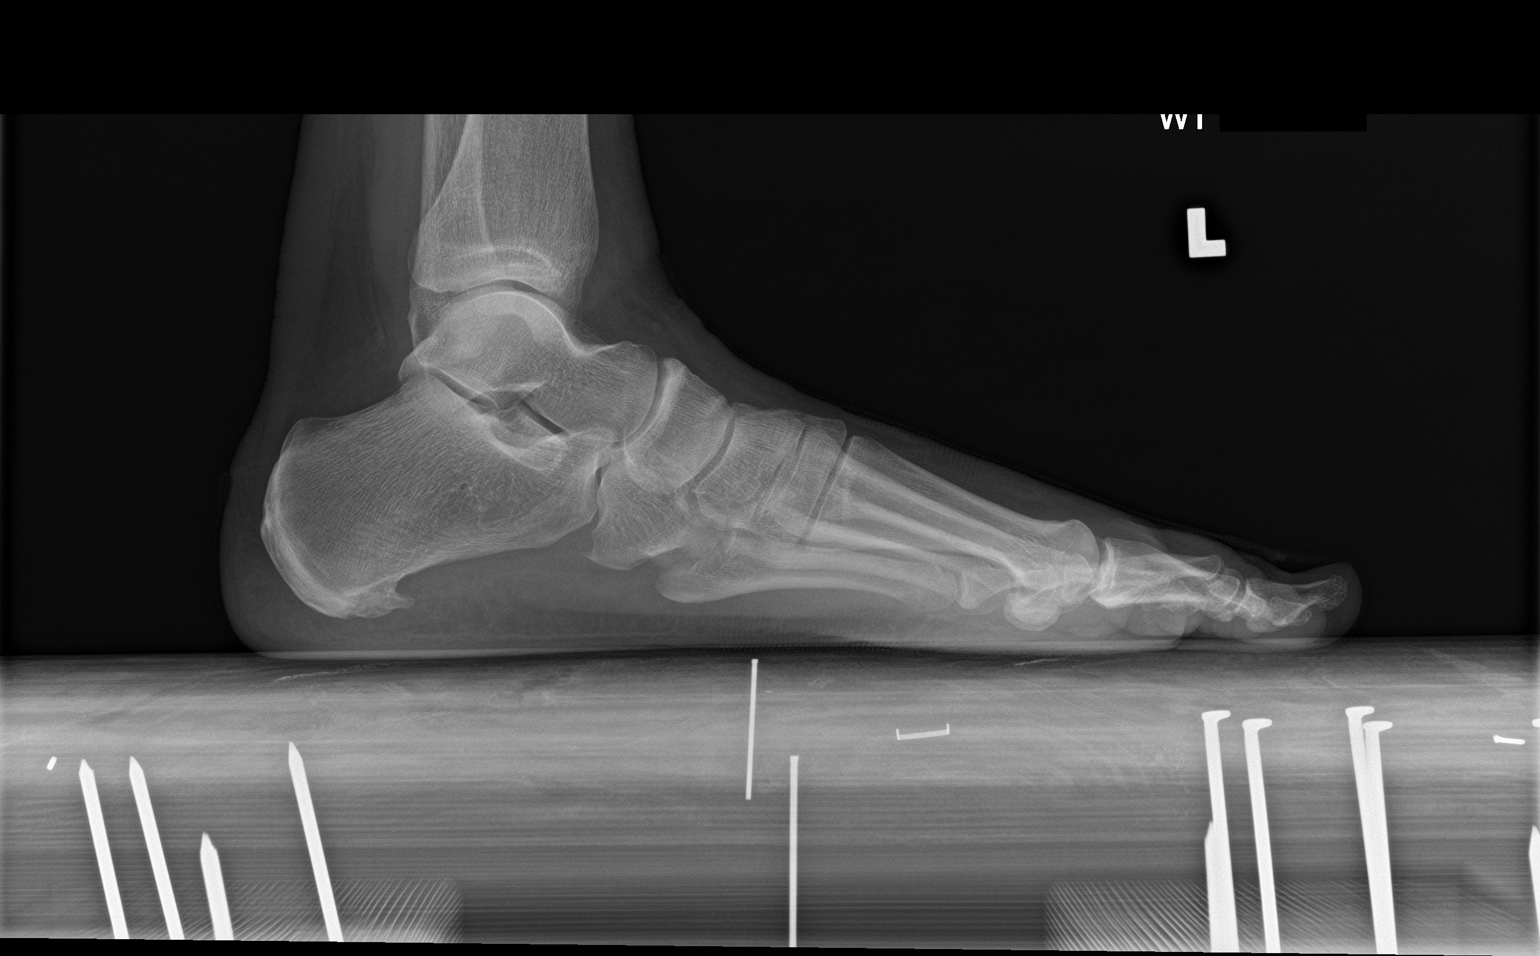

[3 of 3 positions shown; findings below may reference images not displayed]

FINDINGS: Moderate calcaneal heel spur similar to prior. Joint spaces are
preserved. No acute fracture or dislocation.
IMPRESSION: Moderate calcaneal heel spur, similar to prior.

## 2024-01-15 ENCOUNTER — Encounter: Payer: 59 | Admitting: Neurology

## 2024-03-09 ENCOUNTER — Ambulatory Visit: Admitting: Allergy & Immunology

## 2024-07-01 ENCOUNTER — Ambulatory Visit: Admitting: Neurology
